# Patient Record
Sex: Male | Born: 1995 | Race: Black or African American | Hispanic: No | Marital: Single | Smoking: Current some day smoker
Health system: Southern US, Community
[De-identification: ages and names within clinical notes are randomized; demographics above are authoritative.]

## PROBLEM LIST (undated history)

## (undated) DIAGNOSIS — F1210 Cannabis abuse, uncomplicated: Secondary | ICD-10-CM

## (undated) DIAGNOSIS — T40601A Poisoning by unspecified narcotics, accidental (unintentional), initial encounter: Secondary | ICD-10-CM

---
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
## 2017-01-15 ENCOUNTER — Ambulatory Visit (HOSPITAL_COMMUNITY)
Admission: EM | Admit: 2017-01-15 | Discharge: 2017-01-15 | Disposition: A | Discharge status: Home / Self Care | Payer: Medicaid Other | Attending: Family Medicine | Admitting: Family Medicine

## 2017-01-15 ENCOUNTER — Encounter (HOSPITAL_COMMUNITY): Payer: Self-pay | Admitting: Emergency Medicine

## 2017-01-15 DIAGNOSIS — Z202 Contact with and (suspected) exposure to infections with a predominantly sexual mode of transmission: Secondary | ICD-10-CM | POA: Insufficient documentation

## 2017-01-15 DIAGNOSIS — Z113 Encounter for screening for infections with a predominantly sexual mode of transmission: Secondary | ICD-10-CM

## 2017-01-15 DIAGNOSIS — F17200 Nicotine dependence, unspecified, uncomplicated: Secondary | ICD-10-CM | POA: Diagnosis not present

## 2017-01-15 MED ORDER — METRONIDAZOLE 500 MG PO TABS: 500.0000 mg | ORAL_TABLET | Freq: Two times a day (BID) | ORAL | 0 refills | Status: DC

## 2017-01-15 NOTE — Discharge Instructions (Signed)
A test is being run for trichomoniasis. If it is positive, we should know in 2 days and give you a call.  If the test is negative, we may not get back to for a couple extra days (4 days) because we have some a test report.

## 2017-01-15 NOTE — ED Triage Notes (Signed)
Pt here with possible exposure to STD; pt denies sx

## 2017-01-15 NOTE — ED Provider Notes (Signed)
  [facility]   [id number] 01/15/17 Arrival Time: 1758   SUBJECTIVE:  Paul Bonilla is a 21 y.o. male who presents to the urgent care with complaint of exposure to STD; pt denies sx.  Cough friend reports that she was tested recently for STDs and was found to have Trichomonas.     History reviewed. No pertinent past medical history. History reviewed. No pertinent family history. Social History   Social History  . Marital status: Single    Spouse name: N/A  . Number of children: N/A  . Years of education: N/A   Occupational History  . Not on file.   Social History Main Topics  . Smoking status: Current Every Day Smoker  . Smokeless tobacco: Never Used  . Alcohol use Yes     Comment: occ  . Drug use: No  . Sexual activity: Not on file   Other Topics Concern  . Not on file   Social History Narrative  . No narrative on file   No outpatient prescriptions have been marked as taking for the 01/15/17 encounter [facility] Encounter).   No Known Allergies    ROS: As per HPI, remainder of ROS negative.   OBJECTIVE:   Vitals:   01/15/17 1815  BP: 105/69  Pulse: 64  Resp: 18  Temp: 98.6 F (37 C)  TempSrc: Oral  SpO2: 99%     General appearance: alert; no distress Eyes: PERRL; EOMI; conjunctiva normal HENT: normocephalic; atraumatic; TMs normal, canal normal, external ears normal without trauma; nasal mucosa normal; oral mucosa normal Neck: supple Lungs: clear to auscultation bilaterally Heart: regular rate and rhythm Abdomen: soft, non-tender; bowel sounds normal; no masses or organomegaly; no guarding or rebound tenderness Back: no CVA tenderness Extremities: no cyanosis or edema; symmetrical with no gross deformities Skin: warm and dry Neurologic: normal gait; grossly normal Psychological: alert and cooperative; normal mood and affect      Labs:  No results found for this or any previous visit.  Labs Reviewed  URINE CYTOLOGY  ANCILLARY ONLY    No results found.     ASSESSMENT & PLAN:  1. STD exposure     Meds ordered this encounter  Medications  . metroNIDAZOLE (FLAGYL) 500 MG tablet    Sig: Take 1 tablet (500 mg total) by mouth 2 (two) times daily.    Dispense:  14 tablet    Refill:  0    Reviewed expectations re: course of current medical issues. Questions answered. Outlined signs and symptoms indicating need for more acute intervention. Patient verbalized understanding. After Visit Summary given.    Procedures:      Elvina Sidle, MD 01/15/17 [phone]

## 2017-01-16 LAB — URINE CYTOLOGY ANCILLARY ONLY
Chlamydia: NEGATIVE
Neisseria Gonorrhea: NEGATIVE
Trichomonas: NEGATIVE

## 2018-05-07 ENCOUNTER — Other Ambulatory Visit: Payer: Self-pay

## 2018-05-07 ENCOUNTER — Emergency Department (HOSPITAL_COMMUNITY)
Admission: EM | Admit: 2018-05-07 | Discharge: 2018-05-07 | Disposition: A | Discharge status: Home / Self Care | Payer: Medicaid Other | Attending: Emergency Medicine | Admitting: Emergency Medicine

## 2018-05-07 ENCOUNTER — Encounter (HOSPITAL_COMMUNITY): Payer: Self-pay

## 2018-05-07 DIAGNOSIS — F17210 Nicotine dependence, cigarettes, uncomplicated: Secondary | ICD-10-CM | POA: Insufficient documentation

## 2018-05-07 DIAGNOSIS — N342 Other urethritis: Secondary | ICD-10-CM

## 2018-05-07 DIAGNOSIS — Z79899 Other long term (current) drug therapy: Secondary | ICD-10-CM | POA: Insufficient documentation

## 2018-05-07 DIAGNOSIS — Z113 Encounter for screening for infections with a predominantly sexual mode of transmission: Secondary | ICD-10-CM

## 2018-05-07 DIAGNOSIS — N341 Nonspecific urethritis: Secondary | ICD-10-CM | POA: Insufficient documentation

## 2018-05-07 MED ORDER — LIDOCAINE HCL (PF) 1 % IJ SOLN
INTRAMUSCULAR | Status: AC
  Administered 2018-05-07: 2 mL
  Filled 2018-05-07: qty 5

## 2018-05-07 MED ORDER — AZITHROMYCIN 250 MG PO TABS
1000.0000 mg | ORAL_TABLET | Freq: Once | ORAL | Status: AC
  Administered 2018-05-07: 1000 mg via ORAL
  Filled 2018-05-07: qty 4

## 2018-05-07 MED ORDER — CEFTRIAXONE SODIUM 250 MG IJ SOLR
250.0000 mg | Freq: Once | INTRAMUSCULAR | Status: AC
  Administered 2018-05-07: 250 mg via INTRAMUSCULAR
  Filled 2018-05-07: qty 250

## 2018-05-07 NOTE — ED Notes (Signed)
Patient verbalizes understanding of discharge instructions. Opportunity for questioning and answers were provided. Armband removed by staff, pt discharged from ED.  

## 2018-05-07 NOTE — ED Triage Notes (Signed)
Pt presents for eval of STI- pt states he has protected sex "sometimes". Pt endorses burning with urination, slight penile discharge.

## 2018-05-07 NOTE — ED Provider Notes (Signed)
[facility] EMERGENCY DEPARTMENT Provider Note   CSN: [id number] Arrival date & time: 05/07/18  1408     History   Chief Complaint Chief Complaint  Patient presents with  . SEXUALLY TRANSMITTED DISEASE    HPI Paul Bonilla is a 23 y.o. male.  Pt presents to the ED today with pain with urination and penile d/c.  She is sexually active.  He uses condoms occasionally.  No known STD exposure.     History reviewed. No pertinent past medical history.  There are no active problems to display for this patient.   History reviewed. No pertinent surgical history.      Home Medications    Prior to Admission medications   Medication Sig Start Date End Date Taking? Authorizing Provider  metroNIDAZOLE (FLAGYL) 500 MG tablet Take 1 tablet (500 mg total) by mouth 2 (two) times daily. 01/15/17   Elvina Sidle, MD    Family History No family history on file.  Social History Social History   Tobacco Use  . Smoking status: Current Some Day Smoker  . Smokeless tobacco: Never Used  Substance Use Topics  . Alcohol use: Yes    Comment: occ  . Drug use: Yes    Frequency: 7.0 times per week    Types: Marijuana     Allergies   Patient has no known allergies.   Review of Systems Review of Systems  Genitourinary: Positive for discharge and dysuria.  All other systems reviewed and are negative.    Physical Exam Updated Vital Signs BP 122/82 (BP Location: Left Arm)   Pulse 77   Temp 98.7 F (37.1 C) (Oral)   Resp 16   SpO2 98%   Physical Exam Vitals signs and nursing note reviewed. Exam conducted with a chaperone present.  Constitutional:      Appearance: Normal appearance.  HENT:     Head: Normocephalic and atraumatic.     Right Ear: External ear normal.     Left Ear: External ear normal.     Nose: Nose normal.     Mouth/Throat:     Mouth: Mucous membranes are moist.  Eyes:     Extraocular Movements: Extraocular movements intact.   Conjunctiva/sclera: Conjunctivae normal.     Pupils: Pupils are equal, round, and reactive to light.  Neck:     Musculoskeletal: Normal range of motion.  Cardiovascular:     Rate and Rhythm: Normal rate and regular rhythm.     Pulses: Normal pulses.  Pulmonary:     Effort: Pulmonary effort is normal.  Abdominal:     General: Abdomen is flat.  Genitourinary:    Penis: Normal and circumcised.      Scrotum/Testes: Normal.  Neurological:     Mental Status: He is alert and oriented to person, place, and time.      ED Treatments / Results  Labs (all labs ordered are listed, but only abnormal results are displayed) Labs Reviewed  GC/CHLAMYDIA PROBE AMP ([place]) NOT AT [facility]    EKG None  Radiology No results found.  Procedures Procedures (including critical care time)  Medications Ordered in ED Medications  cefTRIAXone (ROCEPHIN) injection 250 mg (has no administration in time range)  azithromycin (ZITHROMAX) tablet 1,000 mg (has no administration in time range)     Initial Impression / Assessment and Plan / ED Course  I have reviewed the triage vital signs and the nursing notes.  Pertinent labs & imaging results that were available during my  care of the patient were reviewed by me and considered in my medical decision making (see chart for details).     Urethral swab obtained.  Pt will be treated for STD with Rocephin and zithromax.  Pt encouraged to use condoms when having sex.  Return if worse.  Final Clinical Impressions(s) / ED Diagnoses   Final diagnoses:  Encounter for screening examination for sexually transmitted disease  Urethritis    ED Discharge Orders    None       Jacalyn LefevreHaviland, Collen Hostler, MD 05/07/18 1442

## 2018-05-10 LAB — GC/CHLAMYDIA PROBE AMP (~~LOC~~) NOT AT ARMC
Chlamydia: POSITIVE — AB
Neisseria Gonorrhea: NEGATIVE

## 2018-07-10 ENCOUNTER — Encounter (HOSPITAL_COMMUNITY): Payer: Self-pay

## 2018-07-10 ENCOUNTER — Emergency Department (HOSPITAL_COMMUNITY): Payer: Self-pay

## 2018-07-10 ENCOUNTER — Other Ambulatory Visit: Payer: Self-pay

## 2018-07-10 ENCOUNTER — Emergency Department (HOSPITAL_COMMUNITY)
Admission: EM | Admit: 2018-07-10 | Discharge: 2018-07-10 | Disposition: A | Discharge status: Home / Self Care | Payer: Self-pay | Attending: Emergency Medicine | Admitting: Emergency Medicine

## 2018-07-10 DIAGNOSIS — J069 Acute upper respiratory infection, unspecified: Secondary | ICD-10-CM | POA: Insufficient documentation

## 2018-07-10 DIAGNOSIS — B9789 Other viral agents as the cause of diseases classified elsewhere: Secondary | ICD-10-CM

## 2018-07-10 DIAGNOSIS — F17290 Nicotine dependence, other tobacco product, uncomplicated: Secondary | ICD-10-CM | POA: Insufficient documentation

## 2018-07-10 LAB — CBC WITH DIFFERENTIAL/PLATELET
Abs Immature Granulocytes: 0.01 10*3/uL (ref 0.00–0.07)
BASOS ABS: 0 10*3/uL (ref 0.0–0.1)
Basophils Relative: 1 %
Eosinophils Absolute: 0.2 10*3/uL (ref 0.0–0.5)
Eosinophils Relative: 4 %
HCT: 40.7 % (ref 39.0–52.0)
Hemoglobin: 13.5 g/dL (ref 13.0–17.0)
Immature Granulocytes: 0 %
Lymphocytes Relative: 40 %
Lymphs Abs: 1.6 10*3/uL (ref 0.7–4.0)
MCH: 31 pg (ref 26.0–34.0)
MCHC: 33.2 g/dL (ref 30.0–36.0)
MCV: 93.6 fL (ref 80.0–100.0)
Monocytes Absolute: 0.5 10*3/uL (ref 0.1–1.0)
Monocytes Relative: 13 %
Neutro Abs: 1.7 10*3/uL (ref 1.7–7.7)
Neutrophils Relative %: 42 %
PLATELETS: 263 10*3/uL (ref 150–400)
RBC: 4.35 MIL/uL (ref 4.22–5.81)
RDW: 12 % (ref 11.5–15.5)
WBC: 4 10*3/uL (ref 4.0–10.5)
nRBC: 0 % (ref 0.0–0.2)

## 2018-07-10 LAB — BASIC METABOLIC PANEL
Anion gap: 8 (ref 5–15)
BUN: 14 mg/dL (ref 6–20)
CO2: 23 mmol/L (ref 22–32)
Calcium: 8.8 mg/dL — ABNORMAL LOW (ref 8.9–10.3)
Chloride: 107 mmol/L (ref 98–111)
Creatinine, Ser: 0.97 mg/dL (ref 0.61–1.24)
GFR calc Af Amer: >60 mL/min (ref >60)
Glucose, Bld: 86 mg/dL (ref 70–99)
Potassium: 4.1 mmol/L (ref 3.5–5.1)
Sodium: 138 mmol/L (ref 135–145)

## 2018-07-10 LAB — MAGNESIUM: MAGNESIUM: 2 mg/dL (ref 1.7–2.4)

## 2018-07-10 LAB — TROPONIN I

## 2018-07-10 LAB — TSH: TSH: 0.897 u[IU]/mL (ref 0.350–4.500)

## 2018-07-10 MED ORDER — BENZONATATE 100 MG PO CAPS: 100.0000 mg | ORAL_CAPSULE | Freq: Three times a day (TID) | ORAL | 0 refills | Status: DC

## 2018-07-10 NOTE — ED Notes (Signed)
Patient transported to X-ray 

## 2018-07-10 NOTE — ED Triage Notes (Signed)
Pt endorses productive cough with green mucous since Monday and "My chest hurts when I cough" Denies travel, exposure, shob, bodyaches or chills. Oral temp 99.3.

## 2018-07-10 NOTE — ED Notes (Signed)
Pt verbalized understanding of d/c instructions and has no further questions, VSS, NAD.  

## 2018-07-10 NOTE — ED Provider Notes (Signed)
[facility] EMERGENCY DEPARTMENT Provider Note   CSN: [id number] Arrival date & time: 07/10/18  1542  History   Chief Complaint Chief Complaint  Patient presents with   Cough    ]   HPI Paul Bonilla is a 23 y.o. male with no signifiant past medical history who presents for evaluation of chest pain.  Patient states he has been sick x1 week.  Patient states he has had chest pain x2 days, worse with cough.  Patient states he is coughing up dark green mucus.  Denies radiation of pain.  Rates his pain a 3/10.  Describes pain as an aching sensation.  Patient states he is also had congestion as well as rhinorrhea which is clear in color.  Patient denies recent travel, fever, body aches and pains, chills, neck pain, neck stiffness, sore throat, shortness of breath, abdominal pain, diarrhea dysuria.  Patient is not any exposures to lab positive coronavirus patient.  Patient states his sister whom he lives is also has similar symptoms.  She states his symptoms have improved since onset last week, however "wants to get checked out."  Denies hemoptysis, lower extremity edema, erythema, warmth, tenderness to bilateral calves.  No PND or orthopnea. Patient states that he has also had intermittent palpitations over the last 48 hours.  Has had increased caffeine use over the last week secondary to using Benadryl for symptoms above. States these were worse yesterday evening when he was trying to fall asleep. Denies hx of previous palpitations, retention, hyperlipidemia, diabetes.  Patient does have a family history of MI at 8 in his cousin.  History obtained from patient.  No interpreter was used.   HPI  History reviewed. No pertinent past medical history.  There are no active problems to display for this patient.   History reviewed. No pertinent surgical history.    Home Medications    Prior to Admission medications   Medication Sig Start Date End Date Taking? Authorizing  Provider  benzonatate (TESSALON) 100 MG capsule Take 1 capsule (100 mg total) by mouth every 8 (eight) hours. 07/10/18   Jazaria Jarecki A, PA-C  metroNIDAZOLE (FLAGYL) 500 MG tablet Take 1 tablet (500 mg total) by mouth 2 (two) times daily. 01/15/17   Elvina Sidle, MD    Family History History reviewed. No pertinent family history.  Social History Social History   Tobacco Use   Smoking status: Current Some Day Smoker    Types: Cigars   Smokeless tobacco: Never Used  Substance Use Topics   Alcohol use: Yes    Comment: occ   Drug use: Yes    Frequency: 7.0 times per week    Types: Marijuana     Allergies   Patient has no known allergies.   Review of Systems Review of Systems  Constitutional: Negative.   HENT: Positive for congestion and rhinorrhea. Negative for postnasal drip, sinus pressure, sinus pain, sneezing, sore throat, tinnitus, trouble swallowing and voice change.   Respiratory: Positive for cough. Negative for apnea, choking, shortness of breath, wheezing and stridor.   Cardiovascular: Positive for chest pain and palpitations. Negative for leg swelling.  Gastrointestinal: Negative.   Genitourinary: Negative.   Musculoskeletal: Negative.   Skin: Negative.   Neurological: Negative.   All other systems reviewed and are negative.  Physical Exam Updated Vital Signs BP 118/80    Pulse 71    Temp 99.3 F (37.4 C) (Oral)    Resp 20    SpO2 97%  Physical Exam Vitals signs and nursing note reviewed.  Constitutional:      General: He is not in acute distress.    Appearance: He is not ill-appearing, toxic-appearing or diaphoretic.  HENT:     Head: Normocephalic and atraumatic.     Jaw: There is normal jaw occlusion.     Right Ear: Tympanic membrane, ear canal and external ear normal. There is no impacted cerumen. No hemotympanum. Tympanic membrane is not injected, scarred, perforated, erythematous, retracted or bulging.     Left Ear: Tympanic membrane, ear  canal and external ear normal. There is no impacted cerumen. No hemotympanum. Tympanic membrane is not injected, scarred, perforated, erythematous, retracted or bulging.     Ears:     Comments: No Mastoid tenderness.    Nose:     Comments: Clear rhinorrhea and congestion to bilateral nares.  No sinus tenderness.    Mouth/Throat:     Comments: Posterior oropharynx clear.  Mucous membranes moist.  Tonsils without erythema or exudate.  Uvula midline without deviation.  No evidence of PTA or RPA.  No drooling, dysphasia or trismus.  Phonation normal. Neck:     Trachea: Trachea and phonation normal.     Meningeal: Brudzinski's sign and Kernig's sign absent.     Comments: No Neck stiffness or neck rigidity.  No meningismus.  No cervical lymphadenopathy. Cardiovascular:     Comments: No murmurs rubs or gallops. Pulmonary:     Comments: Clear to auscultation bilaterally without wheeze, rhonchi or rales.  No accessory muscle usage.  Able speak in full sentences. Chest:     Comments: Pain reproducible to palpation diffusely across chest.  No deformity, swelling, crepitus or edema to chest. Abdominal:     Comments: Soft, nontender without rebound or guarding.  No CVA tenderness.  Musculoskeletal:     Comments: Moves all 4 extremities without difficulty.  Lower extremities without edema, erythema or warmth.  No tenderness to bilateral calves.  Skin:    Comments: Brisk capillary refill.  No rashes or lesions.  Neurological:     Mental Status: He is alert.     Comments: Ambulatory in department without difficulty.  Cranial nerves II through XII grossly intact.  No facial droop.  No aphasia.    ED Treatments / Results  Labs (all labs ordered are listed, but only abnormal results are displayed) Labs Reviewed  BASIC METABOLIC PANEL - Abnormal; Notable for the following components:      Result Value   Calcium 8.8 (*)    All other components within normal limits  CBC WITH DIFFERENTIAL/PLATELET    TROPONIN I  MAGNESIUM  TSH    EKG EKG Interpretation  Date/Time:  Saturday July 10 2018 15:54:39 EDT Ventricular Rate:  78 PR Interval:    QRS Duration: 78 QT Interval:  352 QTC Calculation: 401 R Axis:   56 Text Interpretation:  Sinus rhythm Confirmed by Jacalyn Lefevre [phone]) on 07/10/2018 4:17:29 PM   Radiology Dg Chest 2 View  Result Date: 07/10/2018 CLINICAL DATA:  Productive cough. EXAM: CHEST - 2 VIEW COMPARISON:  None. FINDINGS: The heart size and mediastinal contours are within normal limits. Both lungs are clear. The visualized skeletal structures are unremarkable. IMPRESSION: No active cardiopulmonary disease. Electronically Signed   By: Gerome Sam III M.D   On: 07/10/2018 17:01    Procedures Procedures (including critical care time)  Medications Ordered in ED Medications - No data to display  Initial Impression / Assessment and Plan / ED Course  I have reviewed the triage vital signs and the nursing notes.  Pertinent labs & imaging results that were available during my care of the patient were reviewed by me and considered in my medical decision making (see chart for details).  23 year old male appears otherwise well presents for evaluation of chest pain.  Afebrile, nonseptic, non-ill-appearing.  Has also had flulike symptoms with congestion, rhinorrhea, productive cough of green sputum.  Symptom onset 1 week ago, however have been improving since onset.  No known exposure to lab positive coronavirus patients, recent travel.  Does have family with similar symptoms.  Has also had intermittent palpitations, insetting of increased caffeine intake over the last week.  Clear to auscultation bilateral without wheeze, rhonchi or rales.  No tachypnea, tachycardia or hypoxia, low suspicion for pneumonia. PERC negative, Wells criteria low risk, heart score 1(family hx early age).  CP reproducible to palpation to chest.  Given family history, palpitations obtain labs,  imaging, reevaluate.  Anticipate likely DC home after work up.  Labs and imaging personally viewed by myself:  CBC without leukocytosis, hemoglobin 13.5, troponin negative, given sx for > 24 hours do not feel delta troponin needed at this time. If ACS related feel troponin would be elevated currently, Metabolic panel without electrolyte, renal or liver abnormality, magnesium 2.0, TSH WNL, chest x-ray without evidence of infiltrates, cardiomegaly, pulmonary edema or pneumothorax.  EKG without ischemic changes.  Vitals are stable. No signs of dehydration, tolerating PO's.  Lungs are clear.  Patient will be discharged with instructions to orally hydrate, rest, and use over-the-counter medications such as anti-inflammatories ibuprofen and Aleve for muscle aches and Tylenol for fever.  Patient will also be given a cough suppressant.  He does not meet current CDC criteria for coronavirus testing at this time.  Discussed with patient that this cannot be ruled out due to community acquired coronavirus in [place] at this time.  It is reassuring that patient has had improvement in symptoms over the last week.  Discussed with patient home isolation over the next 3 days given he still currently has symptoms.  PCP for reevaluation if symptoms do not resolve.  Hemodynamically stable and appropriate for DC home at this time.  Discussed strict return precautions.  Patient voiced understanding and is agreeable to follow-up. Palpitations likely due to increased caffeine intake. Discussed cessation of caffeine and FU with PCP for reevaluation.     Paul Bonilla was evaluated in Emergency Department on 07/10/2018 for the symptoms described in the history of present illness. He was evaluated in the context of the global COVID-19 pandemic, which necessitated consideration that the patient might be at risk for infection with the SARS-CoV-2 virus that causes COVID-19. Institutional protocols and algorithms that pertain to the  evaluation of patients at risk for COVID-19 are in a state of rapid change based on information released by regulatory bodies including the CDC and federal and state organizations. These policies and algorithms were followed during the patient's care in the ED. Final Clinical Impressions(s) / ED Diagnoses   Final diagnoses:  Viral URI with cough    ED Discharge Orders         Ordered    benzonatate (TESSALON) 100 MG capsule  Every 8 hours     07/10/18 1744           Kinley Ferrentino A, PA-C 07/10/18 1751    Jacalyn Lefevre, MD 07/10/18 1819

## 2018-07-10 NOTE — Discharge Instructions (Signed)
Evaluated today for chest pain and cough.  This is likely a viral upper respiratory infection I given you Tessalon Perles to  help with your cough.  Take as prescribed.  He did not meet current CDC criteria for coronavirus testing at this time.  I would recommend given you are still having symptoms to stay home over the next 3 days.  If you develop severe shortness of breath, high fever return to the emergency department for evaluation.

## 2018-12-22 ENCOUNTER — Other Ambulatory Visit (HOSPITAL_COMMUNITY)
Admission: RE | Admit: 2018-12-22 | Discharge: 2018-12-22 | Disposition: A | Discharge status: Home / Self Care | Payer: Self-pay | Source: Ambulatory Visit | Attending: Emergency Medicine | Admitting: Emergency Medicine

## 2018-12-22 ENCOUNTER — Other Ambulatory Visit: Payer: Self-pay

## 2018-12-22 ENCOUNTER — Emergency Department
Admission: EM | Admit: 2018-12-22 | Discharge: 2018-12-22 | Disposition: A | Discharge status: Home / Self Care | Payer: Self-pay | Source: Home / Self Care | Attending: Emergency Medicine | Admitting: Emergency Medicine

## 2018-12-22 DIAGNOSIS — Z202 Contact with and (suspected) exposure to infections with a predominantly sexual mode of transmission: Secondary | ICD-10-CM

## 2018-12-22 DIAGNOSIS — N342 Other urethritis: Secondary | ICD-10-CM

## 2018-12-22 DIAGNOSIS — R369 Urethral discharge, unspecified: Secondary | ICD-10-CM

## 2018-12-22 LAB — POCT URINALYSIS DIP (MANUAL ENTRY)
Bilirubin, UA: NEGATIVE
Blood, UA: NEGATIVE
Glucose, UA: NEGATIVE mg/dL
Ketones, POC UA: NEGATIVE mg/dL
Leukocytes, UA: NEGATIVE
Nitrite, UA: NEGATIVE
Protein Ur, POC: NEGATIVE mg/dL
Spec Grav, UA: >=1.03 — AB (ref 1.010–1.025)
Urobilinogen, UA: NEGATIVE E.U./dL — AB
pH, UA: 5.5 (ref 5.0–8.0)

## 2018-12-22 MED ORDER — DOXYCYCLINE HYCLATE 100 MG PO CAPS: 100.0000 mg | ORAL_CAPSULE | Freq: Two times a day (BID) | ORAL | 0 refills | Status: DC

## 2018-12-22 MED ORDER — CEFTRIAXONE SODIUM 250 MG IJ SOLR
250.0000 mg | Freq: Once | INTRAMUSCULAR | Status: AC
  Administered 2018-12-22: 250 mg via INTRAMUSCULAR

## 2018-12-22 MED ORDER — CEFTRIAXONE SODIUM 250 MG IJ SOLR: 500.0000 mg | Freq: Once | INTRAMUSCULAR | Status: DC

## 2018-12-22 NOTE — ED Triage Notes (Signed)
Patient reports discharge from penis and slight dysuria over past 3-4 days; he desires STD testing. He has not travelled past 4 weeks.

## 2018-12-22 NOTE — Discharge Instructions (Addendum)
Based on your history and exam, you likely have urethral infection, possibly STD. Today, we are giving you a shot of Rocephin which would cure gonorrhea. Prescription for doxycycline sent to pharmacy.  Take 1 twice a day for the full 10 days.-This will treat chlamydia. (The doxycycline is instead of taking 5 Zithromax pills, which caused nausea and vomiting for you in the past) We are sending off various tests which should come back in 2 to 3 days. Tests include: Gonorrhea, chlamydia,-from urine sample Trichomonas and bacterial vaginosis and yeast-from the penis insertion swab today. Blood test today: For syphilis and HIV Please read attached instruction sheets on urethritis and safe sex. Follow-up here or with your PCP if no better in 14 days, sooner if worse or new symptoms.

## 2018-12-22 NOTE — ED Provider Notes (Signed)
[facility]    CSN: [id number] Arrival date & time: 12/22/18  0913      History   Chief Complaint Chief Complaint  Patient presents with  . Exposure to STD  Chief complaint: Penile discharge for 1 week or more.  HPI Paul Bonilla is a 23 y.o. male.   HPI Sexually active with new male sex partner for the past month.  Does not always use condoms. Complains of 1 week of penile discharge, slightly yellow sometimes clear.  Occasional dysuria.  Penile lesions or sores or blisters or warts. History of STD in the past. Denies fever or chills or abdominal pain or nausea or vomiting No past medical history on file.  There are no active problems to display for this patient.   No past surgical history on file.     Home Medications    Prior to Admission medications   Medication Sig Start Date End Date Taking? Authorizing Provider  doxycycline (VIBRAMYCIN) 100 MG capsule Take 1 capsule (100 mg total) by mouth 2 (two) times daily. Take for 10 full days until medicine runs out 12/22/18   Lajean ManesMassey, David, MD    Family History No family history on file.  Social History Social History   Tobacco Use  . Smoking status: Current Some Day Smoker    Types: Cigars  . Smokeless tobacco: Never Used  Substance Use Topics  . Alcohol use: Yes    Comment: occ  . Drug use: Yes    Frequency: 7.0 times per week    Types: Marijuana     Allergies   Patient has no known allergies.   Review of Systems Review of Systems Pertinent items noted in HPI and remainder of comprehensive ROS otherwise negative.   Physical Exam Triage Vital Signs ED Triage Vitals  Enc Vitals Group     BP 12/22/18 0942 118/73     Pulse Rate 12/22/18 0942 67     Resp 12/22/18 0942 16     Temp 12/22/18 0942 97.7 F (36.5 C)     Temp Source 12/22/18 0942 Oral     SpO2 12/22/18 0942 97 %     Weight 12/22/18 0943 165 lb (74.8 kg)     Height 12/22/18 0943 5\' 5"  (1.651 m)     Head Circumference --       Peak Flow --      Pain Score 12/22/18 0943 1     Pain Loc --      Pain Edu? --      Excl. in GC? --    No data found.  Updated Vital Signs BP 118/73 (BP Location: Right Arm)   Pulse 67   Temp 97.7 F (36.5 C) (Oral)   Resp 16   Ht 5\' 5"  (1.651 m)   Wt 74.8 kg   SpO2 97%   BMI 27.46 kg/m   Visual Acuity Right Eye Distance:   Left Eye Distance:   Bilateral Distance:    Right Eye Near:   Left Eye Near:    Bilateral Near:     Physical Exam Vitals signs reviewed.  Constitutional:      General: He is not in acute distress.    Appearance: He is well-developed.  HENT:     Head: Normocephalic and atraumatic.  Eyes:     General: No scleral icterus.    Pupils: Pupils are equal, round, and reactive to light.  Neck:     Musculoskeletal: Normal range of motion and neck  supple.  Cardiovascular:     Rate and Rhythm: Normal rate and regular rhythm.  Pulmonary:     Effort: Pulmonary effort is normal.  Abdominal:     General: There is no distension.  Genitourinary:    Comments: No skin lesions. Testicles normal without swelling or tenderness or masses. Penis normal except minimal clearish yellow urethral discharge. No inguinal adenopathy Skin:    General: Skin is warm and dry.  Neurological:     Mental Status: He is alert and oriented to person, place, and time.     Cranial Nerves: No cranial nerve deficit.  Psychiatric:        Behavior: Behavior normal.      UC Treatments / Results  Labs (all labs ordered are listed, but only abnormal results are displayed) Labs Reviewed  POCT URINALYSIS DIP (MANUAL ENTRY) - Abnormal; Notable for the following components:      Result Value   Spec Grav, UA >=1.030 (*)    Urobilinogen, UA negative (*)    All other components within normal limits  C. TRACHOMATIS/N. GONORRHOEAE RNA  RPR  HIV ANTIBODY (ROUTINE TESTING W REFLEX)  CERVICOVAGINAL ANCILLARY ONLY  CERVICOVAGINAL ANCILLARY ONLY    EKG   Radiology No results  found.  Procedures Procedures (including critical care time)  Medications Ordered in UC Medications  cefTRIAXone (ROCEPHIN) injection 250 mg (250 mg Intramuscular Given 12/22/18 1035)    Initial Impression / Assessment and Plan / UC Course  I have reviewed the triage vital signs and the nursing notes.  Pertinent labs & imaging results that were available during my care of the patient were reviewed by me and considered in my medical decision making (see chart for details).    We discussed testing and treatment options at length and after risk benefits alternatives, we performed the following tests: UA today within normal limits. Urine sent for GC and chlamydia testing. At he has request and with his permission, I performed intra-urethral swab with Q-tip, to send for "cervical vaginal ancillary", which includes testing for trichomonas, yeast and bacterial vaginosis pathogens.  He requested aggressive treatment to cover possible GC, Rocephin 250 mg IM given stat. To cover chlamydia, we discussed options.  He states he remembers being treated for chlamydia in the past with Zithromax pills all at once, and that caused nausea and vomiting so he declined Zithromax today. I prescribed doxycycline 100 mg twice daily x10 days and explained he needs to complete the whole 10-day course and explained risks of not doing so.  Advised no sex until completing course of doxycycline.  He voiced understanding and agreement with above.  Final Clinical Impressions(s) / UC Diagnoses   Final diagnoses:  STD exposure  Penile discharge  Urethritis     Discharge Instructions     Based on your history and exam, you likely have urethral infection, possibly STD. Today, we are giving you a shot of Rocephin which would cure gonorrhea. Prescription for doxycycline sent to pharmacy.  Take 1 twice a day for the full 10 days.-This will treat chlamydia. (The doxycycline is instead of taking 5 Zithromax pills,  which caused nausea and vomiting for you in the past) We are sending off various tests which should come back in 2 to 3 days. Tests include: Gonorrhea, chlamydia,-from urine sample Trichomonas and bacterial vaginosis and yeast-from the penis insertion swab today. Blood test today: For syphilis and HIV Please read attached instruction sheets on urethritis and safe sex. Follow-up here or with your PCP  if no better in 14 days, sooner if worse or new symptoms.    ED Prescriptions    Medication Sig Dispense Auth. Provider   doxycycline (VIBRAMYCIN) 100 MG capsule  (Status: Discontinued) Take 1 capsule (100 mg total) by mouth 2 (two) times daily. For a full 10 days, until medicine runs out 20 capsule Jacqulyn Cane, MD   doxycycline (VIBRAMYCIN) 100 MG capsule Take 1 capsule (100 mg total) by mouth 2 (two) times daily. Take for 10 full days until medicine runs out 20 capsule Jacqulyn Cane, MD        Jacqulyn Cane, MD 12/22/18 [phone]

## 2018-12-23 LAB — HIV ANTIBODY (ROUTINE TESTING W REFLEX): HIV 1&2 Ab, 4th Generation: NONREACTIVE

## 2018-12-23 LAB — CERVICOVAGINAL ANCILLARY ONLY
Bacterial vaginitis: NEGATIVE
Candida vaginitis: NEGATIVE
Trichomonas: NEGATIVE

## 2018-12-23 LAB — RPR: RPR Ser Ql: NONREACTIVE

## 2018-12-24 LAB — C. TRACHOMATIS/N. GONORRHOEAE RNA
C. trachomatis RNA, TMA: DETECTED — AB
N. gonorrhoeae RNA, TMA: NOT DETECTED

## 2018-12-27 ENCOUNTER — Encounter (HOSPITAL_COMMUNITY): Payer: Self-pay

## 2018-12-27 ENCOUNTER — Telehealth (HOSPITAL_COMMUNITY): Payer: Self-pay | Admitting: Emergency Medicine

## 2018-12-27 NOTE — Telephone Encounter (Signed)
Chlamydia is positive.  This was treated at the urgent care visit with po doxy.  Pt needs education to please refrain from sexual intercourse for 7 days to give the medicine time to work.  Sexual partners need to be notified and tested/treated.  Condoms may reduce risk of reinfection.  Recheck or followup with PCP for further evaluation if symptoms are not improving.  [facility] notified.  Attempted to reach patient. Someone answered the phone and said they would give him the message.

## 2018-12-29 ENCOUNTER — Telehealth (HOSPITAL_COMMUNITY): Payer: Self-pay | Admitting: Emergency Medicine

## 2018-12-29 NOTE — Telephone Encounter (Signed)
Attempted to reach patient x2. No answer at this time. Voicemail left.    

## 2018-12-31 ENCOUNTER — Telehealth (HOSPITAL_COMMUNITY): Payer: Self-pay | Admitting: Emergency Medicine

## 2018-12-31 NOTE — Telephone Encounter (Signed)
Pt returned call, given swab results, all questions answered.

## 2018-12-31 NOTE — Telephone Encounter (Signed)
Attempted to reach patient x3. No answer at this time. Voicemail left. Letter sent    

## 2020-06-23 IMAGING — CR CHEST - 2 VIEW
2 series · 2 of 2 positions shown · non-contrast
Comparison: None.

CLINICAL DATA: Productive cough.

EXAM:
CHEST - 2 VIEW

[chest pa]
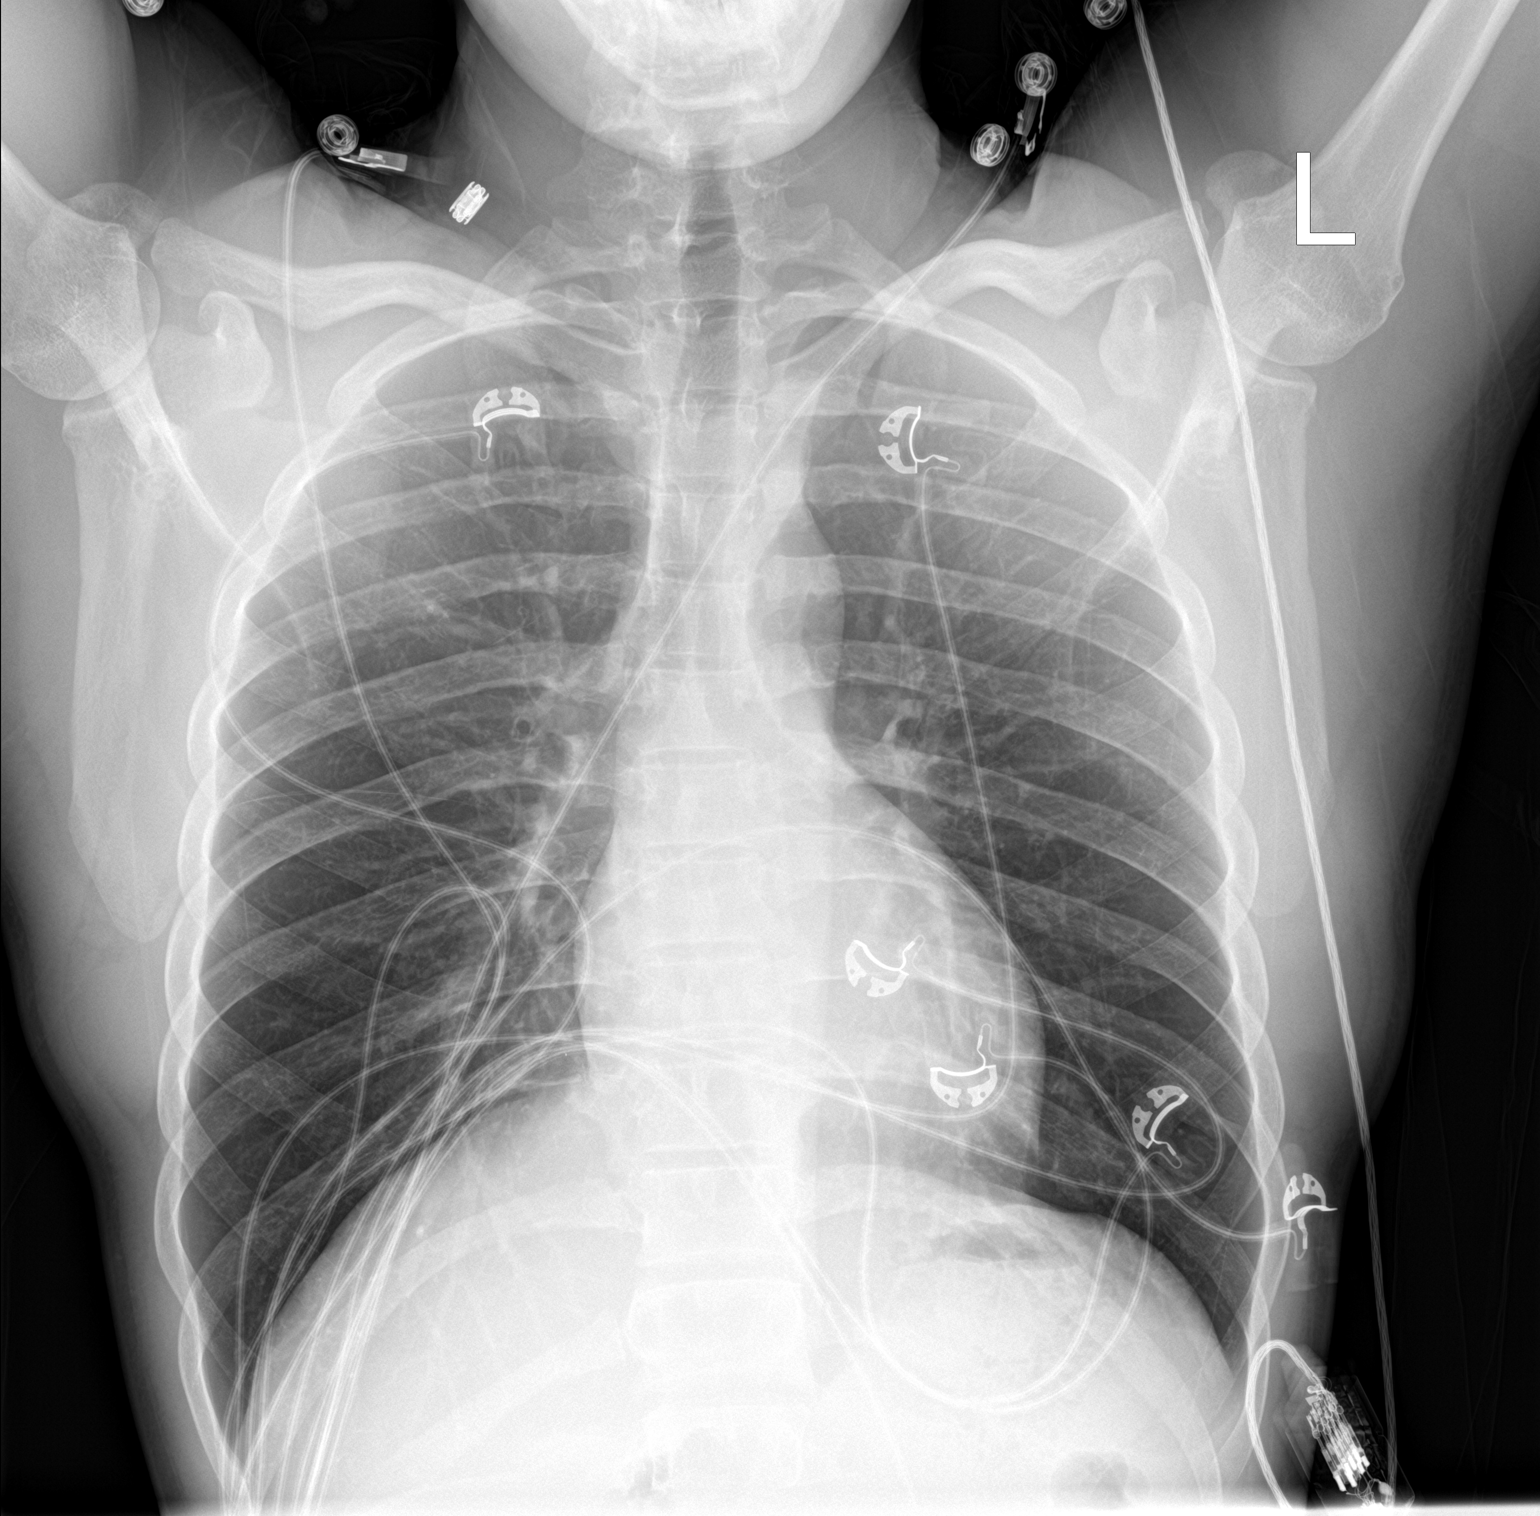

[chest lat]
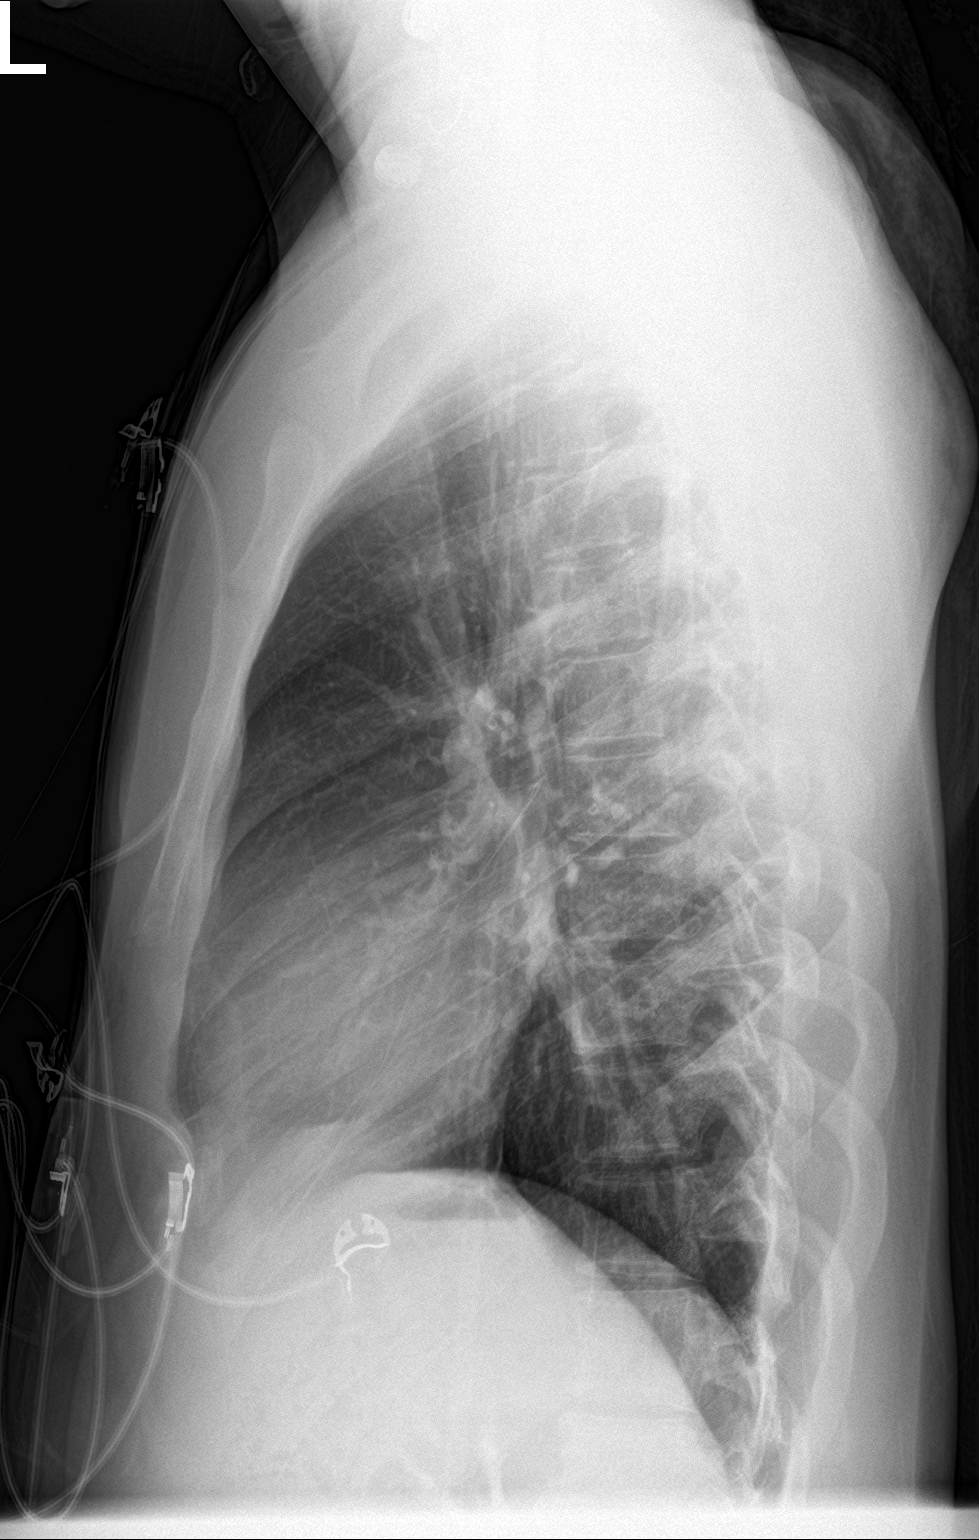

[2 of 2 positions shown; findings below may reference images not displayed]

FINDINGS: The heart size and mediastinal contours are within normal limits.
Both lungs are clear. The visualized skeletal structures are
unremarkable.
IMPRESSION: No active cardiopulmonary disease.

## 2021-11-02 ENCOUNTER — Ambulatory Visit (HOSPITAL_COMMUNITY)
Admission: RE | Admit: 2021-11-02 | Discharge: 2021-11-02 | Disposition: A | Discharge status: Home / Self Care | Payer: Self-pay | Source: Ambulatory Visit | Attending: Physician Assistant | Admitting: Physician Assistant

## 2021-11-02 ENCOUNTER — Encounter (HOSPITAL_COMMUNITY): Payer: Self-pay

## 2021-11-02 VITALS — BP 141/96 | HR 72 | Temp 97.6°F | Resp 16 | Ht 65.0 in | Wt 170.0 lb

## 2021-11-02 DIAGNOSIS — Z202 Contact with and (suspected) exposure to infections with a predominantly sexual mode of transmission: Secondary | ICD-10-CM | POA: Insufficient documentation

## 2021-11-02 DIAGNOSIS — Z113 Encounter for screening for infections with a predominantly sexual mode of transmission: Secondary | ICD-10-CM | POA: Insufficient documentation

## 2021-11-02 LAB — POCT URINALYSIS DIPSTICK, ED / UC
Bilirubin Urine: NEGATIVE
Glucose, UA: NEGATIVE mg/dL
Ketones, ur: NEGATIVE mg/dL
Leukocytes,Ua: NEGATIVE
Nitrite: NEGATIVE
Protein, ur: NEGATIVE mg/dL
Specific Gravity, Urine: 1.025 (ref 1.005–1.030)
Urobilinogen, UA: 1 mg/dL (ref 0.0–1.0)
pH: 5.5 (ref 5.0–8.0)

## 2021-11-02 LAB — HIV ANTIBODY (ROUTINE TESTING W REFLEX): HIV Screen 4th Generation wRfx: NONREACTIVE

## 2021-11-02 NOTE — Discharge Instructions (Addendum)
Advised that STI screening has been performed and the results will be back in 48 hours.  If you do not receive a call from our office then that indicates the test results are negative.  Can go on MyChart to be able to see the results once they post in 24 to 48 hours.

## 2021-11-02 NOTE — ED Provider Notes (Signed)
[facility]    CSN: [id number] Arrival date & time: 11/02/21  1127      History   Chief Complaint Chief Complaint  Patient presents with   Exposure to STD    HPI Paul Bonilla is a 26 y.o. male.   19-year-old male presents for STI screening.  Patient indicates that 2 months ago he had intercourse with partner and this was unprotected.  Patient indicates that he received a call from her indicating that she tested positive for trichomonas.  Patient indicates that he has been having regular intercourse with his present partner, unprotected.  Patient indicates that his partner has been complaining of some vaginal itching and irritability, she was tested and he just received a phone call saying that her test returned negative.  Patient indicates that he is doing well he is not having any penile discharge, no burning on urination, no unusual penile rashes.  Patient desires to be screened for STI to include HIV and RPR screening.  Patient indicates he is doing well without fever or chills.   Exposure to STD    History reviewed. No pertinent past medical history.  There are no problems to display for this patient.   History reviewed. No pertinent surgical history.     Home Medications    Prior to Admission medications   Medication Sig Start Date End Date Taking? Authorizing Provider  doxycycline (VIBRAMYCIN) 100 MG capsule Take 1 capsule (100 mg total) by mouth 2 (two) times daily. Take for 10 full days until medicine runs out 12/22/18   Lajean Manes, MD    Family History History reviewed. No pertinent family history.  Social History Social History   Tobacco Use   Smoking status: Some Days    Types: Cigars   Smokeless tobacco: Never  Substance Use Topics   Alcohol use: Yes    Comment: occ   Drug use: Yes    Frequency: 7.0 times per week    Types: Marijuana     Allergies   Patient has no known allergies.   Review of Systems Review of  Systems   Physical Exam Triage Vital Signs ED Triage Vitals  Enc Vitals Group     BP 11/02/21 1138 (!) 141/96     Pulse Rate 11/02/21 1138 72     Resp 11/02/21 1138 16     Temp 11/02/21 1138 97.6 F (36.4 C)     Temp Source 11/02/21 1138 Oral     SpO2 11/02/21 1138 99 %     Weight 11/02/21 1140 170 lb (77.1 kg)     Height 11/02/21 1140 5\' 5"  (1.651 m)     Head Circumference --      Peak Flow --      Pain Score 11/02/21 1140 0     Pain Loc --      Pain Edu? --      Excl. in GC? --    No data found.  Updated Vital Signs BP (!) 141/96 (BP Location: Right Arm)   Pulse 72   Temp 97.6 F (36.4 C) (Oral)   Resp 16   Ht 5\' 5"  (1.651 m)   Wt 170 lb (77.1 kg)   SpO2 99%   BMI 28.29 kg/m   Visual Acuity Right Eye Distance:   Left Eye Distance:   Bilateral Distance:    Right Eye Near:   Left Eye Near:    Bilateral Near:     Physical Exam Constitutional:  Appearance: Normal appearance.  Genitourinary:    Comments: Genitals: The penile shaft is normal without ulceration or lesions, head of the penis is normal, no penile discharge.  Scrotal sac is normal without swelling or lesions. Neurological:     Mental Status: He is alert.      UC Treatments / Results  Labs (all labs ordered are listed, but only abnormal results are displayed) Labs Reviewed  POCT URINALYSIS DIPSTICK, ED / UC - Abnormal; Notable for the following components:      Result Value   Hgb urine dipstick TRACE (*)    All other components within normal limits  HIV ANTIBODY (ROUTINE TESTING W REFLEX)  RPR  CYTOLOGY, (ORAL, ANAL, URETHRAL) ANCILLARY ONLY    EKG   Radiology No results found.  Procedures Procedures (including critical care time)  Medications Ordered in UC Medications - No data to display  Initial Impression / Assessment and Plan / UC Course  I have reviewed the triage vital signs and the nursing notes.  Pertinent labs & imaging results that were available during my  care of the patient were reviewed by me and considered in my medical decision making (see chart for details).    Plan: 1.  STI screening to include HIV and RPR testing is pending 2.  Patient advised to use protection during sexual contact. 3.  Patient advised to follow-up with PCP or return to urgent care if symptoms fail to improve. Final Clinical Impressions(s) / UC Diagnoses   Final diagnoses:  STD exposure  Routine screening for STI (sexually transmitted infection)     Discharge Instructions      Advised that STI screening has been performed and the results will be back in 48 hours.  If you do not receive a call from our office then that indicates the test results are negative.  Can go on MyChart to be able to see the results once they post in 24 to 48 hours.    ED Prescriptions   None    PDMP not reviewed this encounter.   Ellsworth Lennox, PA-C 11/02/21 1339

## 2021-11-02 NOTE — ED Triage Notes (Signed)
Patient states his partner was tested and treated for a STD. Partners results are not back yet. Patient states his partner has been having vaginal itching and pain.   Patient having penile irritation. No pain with urination, no discharge, no bumps/blisters.

## 2021-11-03 LAB — RPR: RPR Ser Ql: NONREACTIVE

## 2021-11-04 ENCOUNTER — Ambulatory Visit (HOSPITAL_COMMUNITY): Payer: Self-pay

## 2021-11-04 LAB — CYTOLOGY, (ORAL, ANAL, URETHRAL) ANCILLARY ONLY
Comment: NEGATIVE
Trichomonas: NEGATIVE

## 2021-11-05 ENCOUNTER — Telehealth (HOSPITAL_COMMUNITY): Payer: Self-pay | Admitting: Family Medicine

## 2021-11-05 MED ORDER — METRONIDAZOLE 500 MG PO TABS: 2000.0000 mg | ORAL_TABLET | Freq: Once | ORAL | 0 refills | Status: AC

## 2021-11-05 MED ORDER — METRONIDAZOLE 500 MG PO TABS: 2000.0000 mg | ORAL_TABLET | Freq: Three times a day (TID) | ORAL | 0 refills | Status: DC

## 2021-11-05 NOTE — Telephone Encounter (Signed)
Patient called his partner tested positive for trichomonas who was tested at the same time as he however his test came out negative.  Given exposure agreed to treat with metronidazole.  Sending a one-time dose of metronidazole to patient's pharmacy.

## 2021-11-14 ENCOUNTER — Encounter (HOSPITAL_COMMUNITY): Payer: Self-pay | Admitting: Internal Medicine

## 2021-11-14 ENCOUNTER — Observation Stay (HOSPITAL_COMMUNITY)
Admission: EM | Admit: 2021-11-14 | Discharge: 2021-11-15 | Disposition: A | Discharge status: Home / Self Care | Payer: Self-pay | Attending: Internal Medicine | Admitting: Internal Medicine

## 2021-11-14 ENCOUNTER — Emergency Department (HOSPITAL_COMMUNITY): Payer: Self-pay

## 2021-11-14 DIAGNOSIS — F1210 Cannabis abuse, uncomplicated: Secondary | ICD-10-CM | POA: Insufficient documentation

## 2021-11-14 DIAGNOSIS — T40601A Poisoning by unspecified narcotics, accidental (unintentional), initial encounter: Principal | ICD-10-CM | POA: Diagnosis present

## 2021-11-14 DIAGNOSIS — F17290 Nicotine dependence, other tobacco product, uncomplicated: Secondary | ICD-10-CM | POA: Insufficient documentation

## 2021-11-14 DIAGNOSIS — T50901A Poisoning by unspecified drugs, medicaments and biological substances, accidental (unintentional), initial encounter: Secondary | ICD-10-CM

## 2021-11-14 DIAGNOSIS — J9601 Acute respiratory failure with hypoxia: Secondary | ICD-10-CM | POA: Insufficient documentation

## 2021-11-14 DIAGNOSIS — J189 Pneumonia, unspecified organism: Secondary | ICD-10-CM

## 2021-11-14 DIAGNOSIS — J690 Pneumonitis due to inhalation of food and vomit: Secondary | ICD-10-CM | POA: Insufficient documentation

## 2021-11-14 DIAGNOSIS — E876 Hypokalemia: Secondary | ICD-10-CM | POA: Insufficient documentation

## 2021-11-14 DIAGNOSIS — R918 Other nonspecific abnormal finding of lung field: Secondary | ICD-10-CM | POA: Insufficient documentation

## 2021-11-14 DIAGNOSIS — N179 Acute kidney failure, unspecified: Secondary | ICD-10-CM | POA: Insufficient documentation

## 2021-11-14 HISTORY — DX: Poisoning by unspecified narcotics, accidental (unintentional), initial encounter: T40.601A

## 2021-11-14 HISTORY — DX: Cannabis abuse, uncomplicated: F12.10

## 2021-11-14 LAB — COMPREHENSIVE METABOLIC PANEL
ALT: 53 U/L — ABNORMAL HIGH (ref 0–44)
AST: 87 U/L — ABNORMAL HIGH (ref 15–41)
Albumin: 4.1 g/dL (ref 3.5–5.0)
Alkaline Phosphatase: 93 U/L (ref 38–126)
Anion gap: 12 (ref 5–15)
BUN: 15 mg/dL (ref 6–20)
CO2: 19 mmol/L — ABNORMAL LOW (ref 22–32)
Calcium: 8.4 mg/dL — ABNORMAL LOW (ref 8.9–10.3)
Chloride: 104 mmol/L (ref 98–111)
Creatinine, Ser: 1.5 mg/dL — ABNORMAL HIGH (ref 0.61–1.24)
GFR, Estimated: >60 mL/min (ref >60)
Glucose, Bld: 249 mg/dL — ABNORMAL HIGH (ref 70–99)
Potassium: 3.2 mmol/L — ABNORMAL LOW (ref 3.5–5.1)
Sodium: 135 mmol/L (ref 135–145)
Total Bilirubin: 1 mg/dL (ref 0.3–1.2)
Total Protein: 6.8 g/dL (ref 6.5–8.1)

## 2021-11-14 LAB — CBG MONITORING, ED: Glucose-Capillary: 251 mg/dL — ABNORMAL HIGH (ref 70–99)

## 2021-11-14 LAB — RAPID URINE DRUG SCREEN, HOSP PERFORMED
Amphetamines: NOT DETECTED
Barbiturates: NOT DETECTED
Benzodiazepines: NOT DETECTED
Cocaine: NOT DETECTED
Opiates: NOT DETECTED
Tetrahydrocannabinol: POSITIVE — AB

## 2021-11-14 LAB — CBC WITH DIFFERENTIAL/PLATELET
Abs Immature Granulocytes: 0 10*3/uL (ref 0.00–0.07)
Basophils Absolute: 0 10*3/uL (ref 0.0–0.1)
Basophils Relative: 0 %
Eosinophils Absolute: 0.3 10*3/uL (ref 0.0–0.5)
Eosinophils Relative: 3 %
HCT: 42.4 % (ref 39.0–52.0)
Hemoglobin: 13.9 g/dL (ref 13.0–17.0)
Lymphocytes Relative: 51 %
Lymphs Abs: 5.8 10*3/uL — ABNORMAL HIGH (ref 0.7–4.0)
MCH: 30.8 pg (ref 26.0–34.0)
MCHC: 32.8 g/dL (ref 30.0–36.0)
MCV: 94 fL (ref 80.0–100.0)
Monocytes Absolute: 1 10*3/uL (ref 0.1–1.0)
Monocytes Relative: 9 %
Neutro Abs: 4.2 10*3/uL (ref 1.7–7.7)
Neutrophils Relative %: 37 %
Platelets: 278 10*3/uL (ref 150–400)
RBC: 4.51 MIL/uL (ref 4.22–5.81)
RDW: 11.7 % (ref 11.5–15.5)
WBC: 11.3 10*3/uL — ABNORMAL HIGH (ref 4.0–10.5)
nRBC: 0 % (ref 0.0–0.2)
nRBC: 0 /100 WBC

## 2021-11-14 LAB — ETHANOL: Alcohol, Ethyl (B): <10 mg/dL (ref <10)

## 2021-11-14 LAB — ACETAMINOPHEN LEVEL: Acetaminophen (Tylenol), Serum: <10 ug/mL — ABNORMAL LOW (ref 10–30)

## 2021-11-14 LAB — SALICYLATE LEVEL: Salicylate Lvl: <7 mg/dL — ABNORMAL LOW (ref 7.0–30.0)

## 2021-11-14 MED ORDER — SODIUM CHLORIDE 0.9 % IV SOLN
1.0000 g | Freq: Once | INTRAVENOUS | Status: AC
  Administered 2021-11-14: 1 g via INTRAVENOUS
  Filled 2021-11-14: qty 10

## 2021-11-14 MED ORDER — NALOXONE HCL 2 MG/2ML IJ SOSY
1.0000 mg | PREFILLED_SYRINGE | Freq: Once | INTRAMUSCULAR | Status: AC
  Administered 2021-11-14: 1 mg via INTRAVENOUS
  Filled 2021-11-14: qty 2

## 2021-11-14 MED ORDER — NALOXONE HCL 0.4 MG/ML IJ SOLN
0.4000 mg | Freq: Once | INTRAMUSCULAR | Status: AC
  Administered 2021-11-14: 0.4 mg via INTRAVENOUS
  Filled 2021-11-14: qty 1

## 2021-11-14 MED ORDER — AMPICILLIN-SULBACTAM SODIUM 3 (2-1) G IJ SOLR
3.0000 g | Freq: Four times a day (QID) | INTRAMUSCULAR | Status: DC
Start: 2021-11-14 — End: 2021-11-14

## 2021-11-14 MED ORDER — SODIUM CHLORIDE 0.9 % IV SOLN
500.0000 mg | INTRAVENOUS | Status: DC
Start: 2021-11-15 — End: 2021-11-15
  Administered 2021-11-15: 500 mg via INTRAVENOUS
  Filled 2021-11-14: qty 5

## 2021-11-14 MED ORDER — ALBUTEROL SULFATE (2.5 MG/3ML) 0.083% IN NEBU
2.5000 mg | INHALATION_SOLUTION | RESPIRATORY_TRACT | Status: DC | PRN
Start: 2021-11-14 — End: 2021-11-15

## 2021-11-14 MED ORDER — SODIUM CHLORIDE 0.9 % IV SOLN
3.0000 g | Freq: Four times a day (QID) | INTRAVENOUS | Status: DC
  Administered 2021-11-14 – 2021-11-15 (×4): 3 g via INTRAVENOUS
  Filled 2021-11-14 (×5): qty 8

## 2021-11-14 MED ORDER — ACETAMINOPHEN 650 MG RE SUPP: 650.0000 mg | Freq: Four times a day (QID) | RECTAL | Status: DC | PRN

## 2021-11-14 MED ORDER — ENOXAPARIN SODIUM 40 MG/0.4ML IJ SOSY
40.0000 mg | PREFILLED_SYRINGE | INTRAMUSCULAR | Status: DC
  Administered 2021-11-14 – 2021-11-15 (×2): 40 mg via SUBCUTANEOUS
  Filled 2021-11-14 (×2): qty 0.4

## 2021-11-14 MED ORDER — GUAIFENESIN ER 600 MG PO TB12: 600.0000 mg | ORAL_TABLET | Freq: Two times a day (BID) | ORAL | Status: DC | PRN

## 2021-11-14 MED ORDER — ONDANSETRON HCL 4 MG/2ML IJ SOLN
4.0000 mg | Freq: Once | INTRAMUSCULAR | Status: AC
  Administered 2021-11-14: 4 mg via INTRAVENOUS
  Filled 2021-11-14: qty 2

## 2021-11-14 MED ORDER — ONDANSETRON HCL 4 MG/2ML IJ SOLN
4.0000 mg | Freq: Four times a day (QID) | INTRAMUSCULAR | Status: DC | PRN
  Administered 2021-11-14: 4 mg via INTRAVENOUS
  Filled 2021-11-14: qty 2

## 2021-11-14 MED ORDER — ONDANSETRON HCL 4 MG PO TABS: 4.0000 mg | ORAL_TABLET | Freq: Four times a day (QID) | ORAL | Status: DC | PRN

## 2021-11-14 MED ORDER — ACETAMINOPHEN 325 MG PO TABS: 650.0000 mg | ORAL_TABLET | Freq: Four times a day (QID) | ORAL | Status: DC | PRN

## 2021-11-14 MED ORDER — SODIUM CHLORIDE 0.9% FLUSH
3.0000 mL | Freq: Two times a day (BID) | INTRAVENOUS | Status: DC
  Administered 2021-11-15: 3 mL via INTRAVENOUS

## 2021-11-14 MED ORDER — SODIUM CHLORIDE 0.9 % IV SOLN
500.0000 mg | Freq: Once | INTRAVENOUS | Status: AC
  Administered 2021-11-14: 500 mg via INTRAVENOUS
  Filled 2021-11-14: qty 5

## 2021-11-14 MED ORDER — POTASSIUM CHLORIDE 10 MEQ/100ML IV SOLN
10.0000 meq | INTRAVENOUS | Status: AC
  Administered 2021-11-14 (×4): 10 meq via INTRAVENOUS
  Filled 2021-11-14 (×4): qty 100

## 2021-11-14 MED ORDER — NALOXONE HCL 0.4 MG/ML IJ SOLN
0.4000 mg | INTRAMUSCULAR | Status: DC | PRN
Start: 2021-11-14 — End: 2021-11-15
  Administered 2021-11-14: 0.4 mg via INTRAVENOUS
  Filled 2021-11-14: qty 1

## 2021-11-14 MED ORDER — HYDRALAZINE HCL 20 MG/ML IJ SOLN
5.0000 mg | INTRAMUSCULAR | Status: DC | PRN
Start: 2021-11-14 — End: 2021-11-15

## 2021-11-14 MED ORDER — POLYETHYLENE GLYCOL 3350 17 G PO PACK: 17.0000 g | PACK | Freq: Every day | ORAL | Status: DC | PRN

## 2021-11-14 MED ORDER — BISACODYL 5 MG PO TBEC: 5.0000 mg | DELAYED_RELEASE_TABLET | Freq: Every day | ORAL | Status: DC | PRN

## 2021-11-14 MED ORDER — SODIUM CHLORIDE 0.9 % IV SOLN: INTRAVENOUS | Status: DC

## 2021-11-14 NOTE — Discharge Instructions (Signed)
CSW added resources for out patient in patient rehabs

## 2021-11-14 NOTE — ED Provider Notes (Signed)
[facility] EMERGENCY DEPARTMENT Provider Note   CSN: [id number] Arrival date & time: 11/14/21  0245     History  Chief Complaint  Patient presents with   Drug Overdose  Level 5 caveat due to altered mental status  Paul Bonilla is a 26 y.o. male.  The history is provided by the patient.  Drug Overdose This is a new problem. The problem has been gradually improving.  Patient is an otherwise healthy 26 year old who presents after presumed drug overdose.  On EMS arrival patient was unconscious.  Bystanders report that he had used cocaine and  was drinking alcohol.  Patient was hypoxic and was given intranasal and IV Narcan and he improved On my evaluation patient reports he was using cocaine     Home Medications Prior to Admission medications   Not on File      Allergies    Patient has no known allergies.    Review of Systems   Review of Systems  Unable to perform ROS: Mental status change    Physical Exam Updated Vital Signs BP (!) 148/94   Pulse 95   Temp 97.9 F (36.6 C) (Oral)   Resp 18   Ht 1.651 m (5\' 5" )   Wt 77.1 kg   SpO2 94%   BMI 28.29 kg/m  Physical Exam CONSTITUTIONAL: Disheveled, no acute distress HEAD: Normocephalic/atraumatic EYES: EOMI/PERRL ENMT: Mucous membranes moist NECK: supple no meningeal signs SPINE/BACK:entire spine nontender CV: S1/S2 noted, no murmurs/rubs/gallops noted LUNGS: Tachypnea, crackles bilaterally, on supplemental oxygen ABDOMEN: soft, nontender NEURO: Pt is somnolent and slow to respond but will answer most questions appropriately.  He moves all extremities x4 EXTREMITIES: pulses normal/equal, full ROM, no lower extremity edema SKIN: warm, color normal PSYCH: Unable to assess  ED Results / Procedures / Treatments   Labs (all labs ordered are listed, but only abnormal results are displayed) Labs Reviewed  CBC WITH DIFFERENTIAL/PLATELET - Abnormal; Notable for the following components:       Result Value   WBC 11.3 (*)    Lymphs Abs 5.8 (*)    All other components within normal limits  COMPREHENSIVE METABOLIC PANEL - Abnormal; Notable for the following components:   Potassium 3.2 (*)    CO2 19 (*)    Glucose, Bld 249 (*)    Creatinine, Ser 1.50 (*)    Calcium 8.4 (*)    AST 87 (*)    ALT 53 (*)    All other components within normal limits  RAPID URINE DRUG SCREEN, HOSP PERFORMED - Abnormal; Notable for the following components:   Tetrahydrocannabinol POSITIVE (*)    All other components within normal limits  CBG MONITORING, ED - Abnormal; Notable for the following components:   Glucose-Capillary 251 (*)    All other components within normal limits  ETHANOL  SALICYLATE LEVEL  ACETAMINOPHEN LEVEL    EKG EKG Interpretation  Date/Time:  Thursday November 14 2021 02:51:00 EDT Ventricular Rate:  94 PR Interval:  140 QRS Duration: 86 QT Interval:  357 QTC Calculation: 447 R Axis:   55 Text Interpretation: Sinus rhythm Confirmed by [birth year] (Zadie Rhine) on 11/14/2021 3:05:35 AM Prehospital EKG shows sinus rhythm at 93, similar to EKG performed at 2:51 AM Radiology DG Chest [facility] 1 View  Result Date: 11/14/2021 CLINICAL DATA:  Shortness of breath EXAM: PORTABLE CHEST 1 VIEW COMPARISON:  07/10/2018 FINDINGS: Cardiac shadow is mildly prominent but accentuated by the portable technique. Lungs are well aerated bilaterally. Patchy increased airspace  opacity is noted throughout the left lung. No bony abnormality is noted. IMPRESSION: Patchy airspace disease in the left lung consistent with acute infiltrate. Electronically Signed   By: Alcide Clever M.D.   On: 11/14/2021 03:43    Procedures .Critical Care  Performed by: Zadie Rhine, MD Authorized by: Zadie Rhine, MD   Critical care provider statement:    Critical care time (minutes):  75   Critical care start time:  11/14/2021 4:45 AM   Critical care end time:  11/14/2021 6:00 AM   Critical care time was exclusive of:   Separately billable procedures and treating other patients   Critical care was necessary to treat or prevent imminent or life-threatening deterioration of the following conditions:  Toxidrome and respiratory failure   Critical care was time spent personally by me on the following activities:  Obtaining history from patient or surrogate, examination of patient, evaluation of patient's response to treatment, development of treatment plan with patient or surrogate, re-evaluation of patient's condition, pulse oximetry, ordering and review of laboratory studies, ordering and review of radiographic studies and ordering and performing treatments and interventions   I assumed direction of critical care for this patient from another provider in my specialty: no     Care discussed with: admitting provider       Medications Ordered in ED Medications  ondansetron (ZOFRAN) injection 4 mg (has no administration in time range)  cefTRIAXone (ROCEPHIN) 1 g in sodium chloride 0.9 % 100 mL IVPB (0 g Intravenous Stopped 11/14/21 0520)  azithromycin (ZITHROMAX) 500 mg in sodium chloride 0.9 % 250 mL IVPB (0 mg Intravenous Stopped 11/14/21 0650)  naloxone [facility]) injection 0.4 mg (0.4 mg Intravenous Given 11/14/21 0446)  naloxone [facility]) injection 1 mg (1 mg Intravenous Given 11/14/21 0651)    ED Course/ Medical Decision Making/ A&P Clinical Course as of 11/14/21 0734  Thu Nov 14, 2021  0339 Glucose(!): 249 Leukocytosis [DW]  0339 Creatinine(!): 1.50 Renal insufficiency [DW]  0339 WBC(!): 11.3 Leukocytosis [DW]  0432 Patient still very somnolent.  He will arouse to voice and then go back to sleep.  He has an oxygen requirement.  We will give another dose of Narcan [DW]  0532 Discussed with girlfriend at bedside.  She reports that he does use Percocet but not typically any other medications or drugs. [DW]  3016 Patient given Narcan with some improvement, and increased ventilation, but he is still very somnolent.  He  will need to be admitted to the hospital.  We will also need treatment for pneumonia.  Possible this represents aspiration [DW]  [phone] Patient has been reassessed multiple times.  He would drift off to sleep and his oxygen level will drop, but he is easily arousable. [DW]  [phone] Respiratory rate is now decreasing, will give another dose of Narcan.  He is awaiting admission [DW]  0705 Patient now more alert after Narcan, but still requiring oxygen.  Awaiting admission [DW]  [phone] Patient given another milligram of Narcan he is more alert is now vomiting.  However he is still on oxygen.  He will need to be admitted [DW]    Clinical Course User Index [DW] Zadie Rhine, MD                           Medical Decision Making Amount and/or Complexity of Data Reviewed Labs: ordered. Decision-making details documented in ED Course. Radiology: ordered.  Risk Prescription drug management. Decision regarding hospitalization.  This patient presents to the ED for concern of altered mental status, this involves an extensive number of treatment options, and is a complaint that carries with it a high risk of complications and morbidity.  The differential diagnosis includes but is not limited to CVA, metabolic abnormality, drug overdose, alcohol intoxication  Social Determinants of Health: Patient's  substance use disorder   increases the complexity of managing their presentation Patient lacks primary care  Additional history obtained: Records reviewed Care Everywhere/External Records  Lab Tests: I Ordered, and personally interpreted labs.  The pertinent results include: Leukocytosis  Imaging Studies ordered: I ordered imaging studies including X-ray chest   I independently visualized and interpreted imaging which showed pneumonia I agree with the radiologist interpretation  Cardiac Monitoring: The patient was maintained on a cardiac monitor.  I personally viewed and interpreted the cardiac  monitor which showed an underlying rhythm of:  sinus rhythm  Medicines ordered and prescription drug management: I ordered medication including Narcan for presumed overdose Reevaluation of the patient after these medicines showed that the patient    improved   Critical Interventions:  IV antibiotics, Narcan  Consultations Obtained: I requested consultation with the admitting physician triad hospitalist  Reevaluation: After the interventions noted above, I reevaluated the patient and found that they have :improved  Complexity of problems addressed: Patient's presentation is most consistent with  acute presentation with potential threat to life or bodily function  Disposition: After consideration of the diagnostic results and the patient's response to treatment,  I feel that the patent would benefit from admission   .           Final Clinical Impression(s) / ED Diagnoses Final diagnoses:  Accidental overdose, initial encounter  Acute respiratory failure with hypoxia ([place])  Community acquired pneumonia of left lower lobe of lung    Rx / DC Orders ED Discharge Orders     None         Ripley Fraise, MD 11/14/21 [phone]

## 2021-11-14 NOTE — ED Notes (Signed)
Called lab to have salicylate and acetaminophen levels added to previous sample. Per Terrance in the lab, they will add them on.

## 2021-11-14 NOTE — ED Notes (Signed)
ED TO INPATIENT HANDOFF REPORT  ED Nurse Name and Phone #:  Irving Burton RN [phone]  S Name/Age/Gender Paul Bonilla 26 y.o. male Room/Bed: 031C/031C  Code Status   Code Status: Full Code  Home/SNF/Other Home Patient oriented to: self, place, time, and situation Is this baseline? Yes   Triage Complete: Triage complete  Chief Complaint Drug overdose of undetermined intent, initial encounter [T50.904A]  Triage Note BIB EMS upon arrival patient was unconscious. Friends states he was taking unknown drugs and alcohol. Patient alert and awake upon arrival tot ED. 2mg  intranatral and 0.5mg  IV narcan given enroute.    Allergies Allergies  Allergen Reactions   Lactose Intolerance (Gi) Diarrhea    Level of Care/Admitting Diagnosis ED Disposition     ED Disposition  Admit   Condition  --   Comment  Hospital Area: [facility] [[id number]]  Level of Care: Progressive [102]  Admit to Progressive based on following criteria: NEUROLOGICAL AND NEUROSURGICAL complex patients with significant risk of instability, who do not meet ICU criteria, yet require close observation or frequent assessment (< / = every 2 - 4 hours) with medical / nursing intervention.  Admit to Progressive based on following criteria: ACUTE MENTAL DISORDER-RELATED Drug/Alcohol Ingestion/Overdose/Withdrawal, Suicidal Ideation/attempt requiring safety sitter and < Q2h monitoring/assessments, moderate to severe agitation that is managed with medication/sitter, CIWA-Ar score < 20.  May place patient in observation at [facility] or [facility] if equivalent level of care is available:: Yes  Covid Evaluation: Asymptomatic - no recent exposure (last 10 days) testing not required  Diagnosis: Drug overdose of undetermined intent, initial encounter [phone]  Admitting Physician: [[id number]] [[id number]]  Attending Physician: Jonah Blue [[id number]]          B Medical/Surgery History History reviewed. No  pertinent past medical history. History reviewed. No pertinent surgical history.   A IV Location/Drains/Wounds Patient Lines/Drains/Airways Status     Active Line/Drains/Airways     Name Placement date Placement time Site Days   Peripheral IV 11/14/21 18 G Anterior;Distal;Left;Upper Arm 11/14/21  --  Arm  less than 1   Peripheral IV 11/14/21 20 G Anterior;Right Forearm 11/14/21  0923  Forearm  less than 1            Intake/Output Last 24 hours  Intake/Output Summary (Last 24 hours) at 11/14/2021 1654 Last data filed at 11/14/2021 1647 Gross per 24 hour  Intake 163.01 ml  Output 600 ml  Net -436.99 ml    Labs/Imaging Results for orders placed or performed during the hospital encounter of 11/14/21 (from the past 48 hour(s))  CBG monitoring, ED     Status: Abnormal   Collection Time: 11/14/21  2:52 AM  Result Value Ref Range   Glucose-Capillary 251 (H) 70 - 99 mg/dL    Comment: Glucose reference range applies only to samples taken after fasting for at least 8 hours.   Comment 1 Notify RN    Comment 2 Document in Chart   CBC with Differential     Status: Abnormal   Collection Time: 11/14/21  2:53 AM  Result Value Ref Range   WBC 11.3 (H) 4.0 - 10.5 K/uL   RBC 4.51 4.22 - 5.81 MIL/uL   Hemoglobin 13.9 13.0 - 17.0 g/dL   HCT 01/14/22 40.0 - 86.7 %   MCV 94.0 80.0 - 100.0 fL   MCH 30.8 26.0 - 34.0 pg   MCHC 32.8 30.0 - 36.0 g/dL   RDW 61.9 50.9 - 32.6 %  Platelets 278 150 - 400 K/uL   nRBC 0.0 0.0 - 0.2 %   Neutrophils Relative % 37 %   Neutro Abs 4.2 1.7 - 7.7 K/uL   Lymphocytes Relative 51 %   Lymphs Abs 5.8 (H) 0.7 - 4.0 K/uL   Monocytes Relative 9 %   Monocytes Absolute 1.0 0.1 - 1.0 K/uL   Eosinophils Relative 3 %   Eosinophils Absolute 0.3 0.0 - 0.5 K/uL   Basophils Relative 0 %   Basophils Absolute 0.0 0.0 - 0.1 K/uL   nRBC 0 0 /100 WBC   Abs Immature Granulocytes 0.00 0.00 - 0.07 K/uL    Comment: Performed at [facility] [address]  [place], [place] [address]  Comprehensive metabolic panel     Status: Abnormal   Collection Time: 11/14/21  2:53 AM  Result Value Ref Range   Sodium 135 135 - 145 mmol/L   Potassium 3.2 (L) 3.5 - 5.1 mmol/L   Chloride 104 98 - 111 mmol/L   CO2 19 (L) 22 - 32 mmol/L   Glucose, Bld 249 (H) 70 - 99 mg/dL    Comment: Glucose reference range applies only to samples taken after fasting for at least 8 hours.   BUN 15 6 - 20 mg/dL   Creatinine, Ser 1.50 (H) 0.61 - 1.24 mg/dL   Calcium 8.4 (L) 8.9 - 10.3 mg/dL   Total Protein 6.8 6.5 - 8.1 g/dL   Albumin 4.1 3.5 - 5.0 g/dL   AST 87 (H) 15 - 41 U/L   ALT 53 (H) 0 - 44 U/L   Alkaline Phosphatase 93 38 - 126 U/L   Total Bilirubin 1.0 0.3 - 1.2 mg/dL   GFR, Estimated >60 >60 mL/min    Comment: (NOTE) Calculated using the CKD-EPI Creatinine Equation (2021)    Anion gap 12 5 - 15    Comment: Performed at [facility], [place] [address] [place], [place] [address]  Ethanol     Status: None   Collection Time: 11/14/21  2:53 AM  Result Value Ref Range   Alcohol, Ethyl (B) <10 <10 mg/dL    Comment: (NOTE) Lowest detectable limit for serum alcohol is 10 mg/dL.  For medical purposes only. Performed at [facility], [place] [address], [place], [place] [id number]   Salicylate level     Status: Abnormal   Collection Time: 11/14/21  2:53 AM  Result Value Ref Range   Salicylate Lvl [id number] (L) 7.0 - 30.0 mg/dL    Comment: Performed at [facility] [address], [place], [place] [address]  Acetaminophen level     Status: Abnormal   Collection Time: 11/14/21  2:53 AM  Result Value Ref Range   Acetaminophen (Tylenol), Serum <10 (L) 10 - 30 ug/mL    Comment: (NOTE) Therapeutic concentrations vary significantly. A range of 10-30 ug/mL  may be an effective concentration for many patients. However, some  are best treated at concentrations outside of this range. Acetaminophen concentrations >150 ug/mL at 4 hours after ingestion  and >50 ug/mL at  12 hours after ingestion are often associated with  toxic reactions.  Performed at [facility], [place] [address], [place], [place] [address]   Rapid urine drug screen (hospital performed)     Status: Abnormal   Collection Time: 11/14/21  4:22 AM  Result Value Ref Range   Opiates NONE DETECTED NONE DETECTED   Cocaine NONE DETECTED NONE DETECTED   Benzodiazepines  NONE DETECTED NONE DETECTED   Amphetamines NONE DETECTED NONE DETECTED   Tetrahydrocannabinol POSITIVE (A) NONE DETECTED   Barbiturates NONE DETECTED NONE DETECTED    Comment: (NOTE) DRUG SCREEN FOR MEDICAL PURPOSES ONLY.  IF CONFIRMATION IS NEEDED FOR ANY PURPOSE, NOTIFY LAB WITHIN 5 DAYS.  LOWEST DETECTABLE LIMITS FOR URINE DRUG SCREEN Drug Class                     Cutoff (ng/mL) Amphetamine and metabolites    1000 Barbiturate and metabolites    200 Benzodiazepine                 200 Tricyclics and metabolites     300 Opiates and metabolites        300 Cocaine and metabolites        300 THC                            50 Performed at [facility], [address] [place], [place] [address]    DG Chest Port 1 View  Result Date: 11/14/2021 CLINICAL DATA:  Shortness of breath EXAM: PORTABLE CHEST 1 VIEW COMPARISON:  07/10/2018 FINDINGS: Cardiac shadow is mildly prominent but accentuated by the portable technique. Lungs are well aerated bilaterally. Patchy increased airspace opacity is noted throughout the left lung. No bony abnormality is noted. IMPRESSION: Patchy airspace disease in the left lung consistent with acute infiltrate. Electronically Signed   By: Alcide Clever M.D.   On: 11/14/2021 03:43    Pending Labs Unresulted Labs (From admission, onward)     Start     Ordered   11/15/21 0500  Basic metabolic panel  Tomorrow morning,   R        11/14/21 0845   11/15/21 0500  CBC  Tomorrow morning,   R        11/14/21 0845   11/14/21 0844  Expectorated Sputum Assessment w Gram Stain, Rflx to Resp Cult   (COPD / Pneumonia / Cellulitis / Lower Extremity Wound)  Once,   R        11/14/21 0845            Vitals/Pain Today's Vitals   11/14/21 1430 11/14/21 1600 11/14/21 1602 11/14/21 1615  BP: (!) 169/95   129/79  Pulse: 82 77  65  Resp: 15 14  16   Temp:   98.9 F (37.2 C)   TempSrc:   Oral   SpO2: 95% 98%  98%  Weight:      Height:      PainSc:        Isolation Precautions No active isolations  Medications Medications  azithromycin (ZITHROMAX) 500 mg in sodium chloride 0.9 % 250 mL IVPB (has no administration in time range)  enoxaparin (LOVENOX) injection 40 mg (40 mg Subcutaneous Given 11/14/21 0924)  0.9 %  sodium chloride infusion ( Intravenous Infusion Verify 11/14/21 1024)  acetaminophen (TYLENOL) tablet 650 mg (has no administration in time range)    Or  acetaminophen (TYLENOL) suppository 650 mg (has no administration in time range)  polyethylene glycol (MIRALAX / GLYCOLAX) packet 17 g (has no administration in time range)  bisacodyl (DULCOLAX) EC tablet 5 mg (has no administration in time range)  ondansetron (ZOFRAN) tablet 4 mg (has no administration in time range)    Or  ondansetron (ZOFRAN) injection 4 mg (has no administration in time range)  albuterol (PROVENTIL) (2.5 MG/3ML) 0.083%  nebulizer solution 2.5 mg (has no administration in time range)  guaiFENesin (MUCINEX) 12 hr tablet 600 mg (has no administration in time range)  hydrALAZINE (APRESOLINE) injection 5 mg (has no administration in time range)  naloxone [facility]) injection 0.4 mg (0.4 mg Intravenous Given 11/14/21 1031)  sodium chloride flush (NS) 0.9 % injection 3 mL (3 mLs Intravenous Not Given 11/14/21 0931)  Ampicillin-Sulbactam (UNASYN) 3 g in sodium chloride 0.9 % 100 mL IVPB (0 g Intravenous Stopped 11/14/21 1414)  cefTRIAXone (ROCEPHIN) 1 g in sodium chloride 0.9 % 100 mL IVPB (0 g Intravenous Stopped 11/14/21 0520)  azithromycin (ZITHROMAX) 500 mg in sodium chloride 0.9 % 250 mL IVPB (0 mg Intravenous  Stopped 11/14/21 0650)  naloxone (NARCAN) injection 0.4 mg (0.4 mg Intravenous Given 11/14/21 0446)  naloxone (NARCAN) injection 1 mg (1 mg Intravenous Given 11/14/21 0651)  ondansetron (ZOFRAN) injection 4 mg (4 mg Intravenous Given 11/14/21 0850)    Mobility walks Moderate fall risk   Focused Assessments Cardiac Assessment Handoff:  Cardiac Rhythm: Normal sinus rhythm Lab Results  Component Value Date   TROPONINI <0.03 07/10/2018   No results found for: "DDIMER" Does the Patient currently have chest pain? No   , Neuro Assessment Handoff:  Swallow screen pass? Yes  Cardiac Rhythm: Normal sinus rhythm        , Pulmonary Assessment Handoff:  Lung sounds:   O2 Device: Nasal Cannula O2 Flow Rate (L/min): 4 L/min    R Recommendations: See Admitting Provider Note  Report given to:   Additional Notes:  Pt endorsing that he used percocet last night. Pt is now A&Ox4 and has not required anymore narcan administration since 1031 today.

## 2021-11-14 NOTE — ED Notes (Signed)
Pt endorsing he used percocet.

## 2021-11-14 NOTE — ED Triage Notes (Signed)
BIB EMS upon arrival patient was unconscious. Friends states he was taking unknown drugs and alcohol. Patient alert and awake upon arrival tot ED. 2mg  intranatral and 0.5mg  IV narcan given enroute.

## 2021-11-14 NOTE — H&P (Signed)
History and Physical    Patient: Paul Bonilla:[id number] DOB: 1996/01/16 DOA: 11/14/2021 DOS: the patient was seen and examined on 11/14/2021 PCP: Patient, No Pcp Per  Patient coming from: Home - lives with girlfriend and her son; Jackey Loge: [place] Shown, [phone]   Chief Complaint: Drug overdose  HPI: Paul Bonilla is a 26 y.o. male with medical history significant of polysubstance abuse presenting with AMS, concern for drug overdose.  He was found unresponsive, reports passing out.  He was with his cousin, who got concerned.  His grandmother reports that he was not feeling well, coughing a coarse cough maybe Sunday.  There was question of cocaine by EMS and the EDP but patient appears to now acknowledge taking too much Percocet.  He has been periodically hypersomnolent and responded each time very effectively to Narcan.  He is finally becoming more alert and appears ready to begin to advance his diet, according to nursing.  At the time of my evaluation, his girlfriend's 7yo son was in the room and was coughing periodically and appeared congested.    ER Course:  Opiate overdose.  Given Narcan with improvement.  O2 requirement, PNA on CXR.  Waxing and waning MS.  Wide awake after Narcan with n/v.  ?aspiration.  UDS only + for THC.     Review of Systems: unable to review all systems due to the inability of the patient to answer questions. Past Medical History:  Diagnosis Date   Marijuana abuse    Opiate overdose, accidental or unintentional, initial encounter [facility])    History reviewed. No pertinent surgical history. Social History:  reports that he has been smoking cigars. He has never used smokeless tobacco. He reports current alcohol use. He reports current drug use. Frequency: 7.00 times per week. Drug: Marijuana.  Allergies  Allergen Reactions   Lactose Intolerance (Gi) Diarrhea    History reviewed. No pertinent family history.  Prior to Admission medications   Not on File     Physical Exam: Vitals:   11/14/21 1602 11/14/21 1615 11/14/21 1730 11/14/21 1800  BP:  129/79 136/78 119/78  Pulse:  65 69 75  Resp:  16 13 13   Temp: 98.9 F (37.2 C)     TempSrc: Oral     SpO2:  98% 98% 98%  Weight:      Height:       General:  Appears calm and comfortable and is in NAD; very somnolent but protecting his airway Eyes:   EOMI, normal lids, iris ENT:  grossly normal hearing, lips & tongue, mmm Neck:  no LAD, masses or thyromegaly Cardiovascular:  RRR, no m/r/g. No LE edema.  Respiratory:   CTA bilaterally with no wheezes/rales/rhonchi.  Normal respiratory effort. Abdomen:  soft, NT, ND Skin:  no rash or induration seen on limited exam Musculoskeletal:  grossly normal tone BUE/BLE, good ROM, no bony abnormality Psychiatric:  somnolent mood and affect, speech sparse but appropriate Neurologic:  unable to effectively evaluate   Radiological Exams on Admission: Independently reviewed - see discussion in A/P where applicable  DG Chest Port 1 View  Result Date: 11/14/2021 CLINICAL DATA:  Shortness of breath EXAM: PORTABLE CHEST 1 VIEW COMPARISON:  07/10/2018 FINDINGS: Cardiac shadow is mildly prominent but accentuated by the portable technique. Lungs are well aerated bilaterally. Patchy increased airspace opacity is noted throughout the left lung. No bony abnormality is noted. IMPRESSION: Patchy airspace disease in the left lung consistent with acute infiltrate. Electronically Signed   By: 07/12/2018  M.D.   On: 11/14/2021 03:43    EKG: Independently reviewed.  NSR with rate 94; no evidence of acute ischemia   Labs on Admission: I have personally reviewed the available labs and imaging studies at the time of the admission.  Pertinent labs:    K+ 3.2 CO2 19 Glucose 249 BUN 15/Creatinine 1.5/GFR >60 AST 87/ALT 53 WBC 11.3 ETOH <10 UDS + THC   Assessment and Plan: Principal Problem:   Opiate overdose, accidental or unintentional, initial encounter  (HCC) Active Problems:   Marijuana abuse   Aspiration pneumonia (HCC)   Hypokalemia    Drug overdose -Appears to have been an unintentional overdose of Percocet -The patient reported that he was "chilling" with his cousin prior to event -Denies SI -Of note, Percocet is not a prescribed medication for him -Will observe in progressive care -He has required periodic doses of Narcan, but each time responded appropriately and awoke  PNA -His grandmother reports a coarse cough over the weekend but certainly with his altered LOC aspiration is a serious consideration -He remains hypoxic, as low as 62% on RA and currently on 4L [place] O2 -Hopefully as the narcotic wears off and he gets more alert he will be able to wean off O2 -He is currently on Unasyn and Azithromycin for both aspiration and CAP coverage  Hypokalemia -Mild -Will replete with IV KCl since aspiration is still a consideration  Marijuana abuse -Cessation encouraged; this should be encouraged on an ongoing basis -UDS ordered     Advance Care Planning:   Code Status: Full Code   Consults: RT; TOC team  DVT Prophylaxis: Lovenox  Family Communication: Girlfriend, mother, and grandmother were present throughout evaluation  Severity of Illness: The appropriate patient status for this patient is OBSERVATION. Observation status is judged to be reasonable and necessary in order to provide the required intensity of service to ensure the patient's safety. The patient's presenting symptoms, physical exam findings, and initial radiographic and laboratory data in the context of their medical condition is felt to place them at decreased risk for further clinical deterioration. Furthermore, it is anticipated that the patient will be medically stable for discharge from the hospital within 2 midnights of admission.   Author: Jonah Blue, MD 11/14/2021 6:25 PM  For on call review [email or web address].

## 2021-11-15 LAB — CBC
HCT: 39.4 % (ref 39.0–52.0)
Hemoglobin: 13.6 g/dL (ref 13.0–17.0)
MCH: 31.7 pg (ref 26.0–34.0)
MCHC: 34.5 g/dL (ref 30.0–36.0)
MCV: 91.8 fL (ref 80.0–100.0)
Platelets: 236 10*3/uL (ref 150–400)
RBC: 4.29 MIL/uL (ref 4.22–5.81)
RDW: 11.7 % (ref 11.5–15.5)
WBC: 10.7 10*3/uL — ABNORMAL HIGH (ref 4.0–10.5)
nRBC: 0 % (ref 0.0–0.2)

## 2021-11-15 LAB — BASIC METABOLIC PANEL
Anion gap: 9 (ref 5–15)
BUN: 9 mg/dL (ref 6–20)
CO2: 26 mmol/L (ref 22–32)
Calcium: 9.2 mg/dL (ref 8.9–10.3)
Chloride: 104 mmol/L (ref 98–111)
Creatinine, Ser: 1.01 mg/dL (ref 0.61–1.24)
GFR, Estimated: >60 mL/min (ref >60)
Glucose, Bld: 87 mg/dL (ref 70–99)
Potassium: 3.5 mmol/L (ref 3.5–5.1)
Sodium: 139 mmol/L (ref 135–145)

## 2021-11-15 MED ORDER — AMOXICILLIN-POT CLAVULANATE 875-125 MG PO TABS
1.0000 | ORAL_TABLET | Freq: Two times a day (BID) | ORAL | Status: DC
  Administered 2021-11-15: 1 via ORAL
  Filled 2021-11-15: qty 1

## 2021-11-15 MED ORDER — AMOXICILLIN-POT CLAVULANATE 875-125 MG PO TABS: 1.0000 | ORAL_TABLET | Freq: Two times a day (BID) | ORAL | 0 refills | Status: DC

## 2021-11-15 NOTE — TOC Initial Note (Signed)
Transition of Care [facility]) - Initial/Assessment Note    Patient Details  Name: Paul Bonilla MRN: [id number] Date of Birth: [birth year]  Transition of Care [facility]) CM/SW Contact:    Bethann Berkshire, [place] Phone Number: 11/15/2021, 11:09 AM  Clinical Narrative:                  CSW met with pt in response to substance use/overdose consult. Pt explains he had snorted a small amount of cocaine and then very quickly felt drowsy and intoxicated and then became unconscious. He reports occasional percocet use and cocaine use. Reports he uses THC as well. Pt explained at time of incident he had not used any other substances other than what he thought was cocaine. Pt's UDS was negative for cocaine. CSW discussed possibility that fentanyl could have been in the substance he used based on negative UDS and response to narcan. Pt state he does not feel he has a sever problem with his substance use though does report some desire of stop using cocaine. He is interested in outpatient counseling resources which CSW provides. CSW also discusses narcan and fentanyl test strips to prevent and reverse overdose and provided list of resources where pt could obtain these.   Expected Discharge Plan: Home/Self Care Barriers to Discharge: No Barriers Identified   Patient Goals and CMS Choice        Expected Discharge Plan and Services Expected Discharge Plan: Home/Self Care                                              Prior Living Arrangements/Services                       Activities of Daily Living      Permission Sought/Granted                  Emotional Assessment              Admission diagnosis:  Acute respiratory failure with hypoxia ([place]) [J96.01] Accidental overdose, initial encounter [place] acquired pneumonia of left lower lobe of lung [J18.9] Drug overdose of undetermined intent, initial encounter [T50.904A] Patient Active Problem List   Diagnosis  Date Noted   Opiate overdose, accidental or unintentional, initial encounter ([place]) 11/14/2021   Marijuana abuse 11/14/2021   Aspiration pneumonia ([place]) 11/14/2021   Hypokalemia 11/14/2021   PCP:  Patient, No Pcp Per Pharmacy:   [id number] [place], [address] [place]Phone: [phone]: [phone] [id number] [place]: [phone]: [address]hone: [phone]: [id number] [place]: [phone]: [phone]  Social Determinants of Health (SDOH) Interventions    Readmission Risk Interventions     No data to display

## 2021-11-15 NOTE — Progress Notes (Signed)
Pt NSR on monitor and A/O x 4. Admitted for opiate overdose. See note. Denies pain and SOB. Education provided. Pt adequate for discharge

## 2021-11-15 NOTE — Discharge Summary (Signed)
DISCHARGE SUMMARY  FLORIAN CHAUCA  MR#: [id number]  DOB:07/09/1995  Date of Admission: 11/14/2021 Date of Discharge: 11/15/2021  Attending Physician:Sequoia Mincey Silvestre Gunner, MD  Patient's NWG:[id number], No Pcp Per  Consults: none   Disposition: D/C home   Follow-up Appts:  Follow-up Information     [facility]. Go on 12/30/2021.   Specialty: Internal Medicine Why: @9 : information: [address] [place] [phone]               Discharge Diagnoses: Drug overdose - narcotic  Left lung pulmonary infiltrates Acute hypoxic respiratory failure Possible aspiration pneumonia v/s pneumonitis  Acute kidney injury POA Hypokalemia  Initial presentation: 26 year old with a history of polysubstance abuse who presented to the ER with altered mental status after being found unresponsive/passed out.  In the ER he was periodically hypersomnolent but responded favorably each time he was dosed with Narcan.  He acknowledges "snorting" a small amount of what he believed was cocaine, and shortly thereafter feeling "very different" than he typically does when using cocaine. Shortly thereafter he became unresponsive.  CXR in the ER raised the question of diffuse left lung infiltrates. He responded w/ abrupt improvement in mental status with each dose of narcan administered via EMS and in the ER.   Hospital Course:  Drug overdose - Narcotic  Appears to have been accidental/recreational with no evidence of intended self-harm -responded favorably to periodic doses of Narcan but did not require Narcan drip -fortunately acetaminophen level was undetectable -salicylate level also undetectable - suspicion is that the substance he inhaled was a strong narcotic and not cocaine (UDS negative for cocaine) - he has been counseled on absolute need to avoid all illicit substances, and on the prominence of fentanyl in the community as well as the high  risk of death from fentanyl abuse - he has also been counseled on the availability of narcan via numerous community resources   Left lung pulmonary infiltrates -acute hypoxic respiratory failure Suspicious for aspiration in this clinical setting -respiratory failure at presentation evidenced by oxygen saturation of 62% on room air, hypoventilation, and obtundation - rapidly improved clinically w/ improved mental status - will complete only 3 days of abx as this is likely a chemical pneumonitis v/s a true bacterial infection    Acute kidney injury POA Baseline creatinine per records is 0.97 -creatinine at presentation was 1.5 representing a significant increase likely due to poor intake in the setting of narcotic overdose -renal function rapidly normalized with volume expansion   Hypokalemia Corrected with supplementation  Allergies as of 11/15/2021       Reactions   Lactose Intolerance (gi) Diarrhea        Medication List     STOP taking these medications    doxycycline 100 MG capsule Commonly known as: VIBRAMYCIN       TAKE these medications    amoxicillin-clavulanate 875-125 MG tablet Commonly known as: AUGMENTIN Take 1 tablet by mouth every 12 (twelve) hours. Notes to patient: Take tomorrow morning 11/16/21        Day of Discharge BP (!) 151/107 (BP Location: Right Arm)   Pulse 94   Temp 98.1 F (36.7 C) (Oral)   Resp 19   Ht 5\' 5"  (1.651 m)   Wt 83.8 kg   SpO2 97%   BMI 30.74 kg/m   Physical Exam: General: No acute respiratory distress Lungs: Clear to auscultation bilaterally without wheezes or crackles Cardiovascular: Regular rate  and rhythm without murmur gallop or rub normal S1 and S2 Abdomen: Nontender, nondistended, soft, bowel sounds positive, no rebound, no ascites, no appreciable mass Extremities: No significant cyanosis, clubbing, or edema bilateral lower extremities  Basic Metabolic Panel: Recent Labs  Lab 11/14/21 0253 11/15/21 0428  NA 135  139  K 3.2* 3.5  CL 104 104  CO2 19* 26  GLUCOSE 249* 87  BUN 15 9  CREATININE 1.50* 1.01  CALCIUM 8.4* 9.2    Liver Function Tests: Recent Labs  Lab 11/14/21 0253  AST 87*  ALT 53*  ALKPHOS 93  BILITOT 1.0  PROT 6.8  ALBUMIN 4.1    CBC: Recent Labs  Lab 11/14/21 0253 11/15/21 0428  WBC 11.3* 10.7*  NEUTROABS 4.2  --   HGB 13.9 13.6  HCT 42.4 39.4  MCV 94.0 91.8  PLT 278 236    Time spent in discharge (includes decision making & examination of pt): 30 minutes  11/15/2021, 4:04 PM   Lonia Blood, MD [facility] Office  [phone]

## 2021-11-15 NOTE — Plan of Care (Signed)
  Problem: Pain Managment: Goal: General experience of comfort will improve Outcome: Completed/Met   Problem: Skin Integrity: Goal: Risk for impaired skin integrity will decrease Outcome: Completed/Met

## 2021-12-30 ENCOUNTER — Ambulatory Visit: Payer: Self-pay | Admitting: Nurse Practitioner

## 2022-11-28 ENCOUNTER — Ambulatory Visit (HOSPITAL_COMMUNITY)
Admission: EM | Admit: 2022-11-28 | Discharge: 2022-11-28 | Disposition: A | Discharge status: Home / Self Care | Payer: Self-pay | Attending: Nurse Practitioner | Admitting: Nurse Practitioner

## 2022-11-28 ENCOUNTER — Encounter (HOSPITAL_COMMUNITY): Payer: Self-pay

## 2022-11-28 DIAGNOSIS — Z202 Contact with and (suspected) exposure to infections with a predominantly sexual mode of transmission: Secondary | ICD-10-CM | POA: Insufficient documentation

## 2022-11-28 DIAGNOSIS — N4889 Other specified disorders of penis: Secondary | ICD-10-CM | POA: Insufficient documentation

## 2022-11-28 LAB — HIV ANTIBODY (ROUTINE TESTING W REFLEX): HIV Screen 4th Generation wRfx: NONREACTIVE

## 2022-11-28 MED ORDER — TINIDAZOLE 500 MG PO TABS: 2.0000 g | ORAL_TABLET | Freq: Once | ORAL | 0 refills | Status: AC

## 2022-11-28 NOTE — ED Triage Notes (Signed)
Patient c/o penile discomfort and nausea x 1-2 weeks. Patient also reports that a sexual partner told him that she had trichomonas.

## 2022-11-28 NOTE — Discharge Instructions (Signed)
We will contact you if any of the testing from today comes back positive.  In the meantime, take the tinidazole as prescribed to treat for trichomoniasis.  Recommend condom use with every sexual encounter to prevent STI.

## 2022-11-28 NOTE — ED Provider Notes (Signed)
[facility]    CSN: [id number] Arrival date & time: 11/28/22  4132      History   Chief Complaint No chief complaint on file.   HPI Paul Bonilla is a 27 y.o. male.   Patient presents today for intermittent penile and testicle pain for the past 1-2 weeks. Denies sores, rashes, lesions on genitalia. No penile discharge or urinary symptoms.  Reports a sexual partner recently told him they tested positive for trichomoniasis.  No other known exposures.  Denies abdominal pain, pelvic pain, nausea, vomiting, and fevers.    Past Medical History:  Diagnosis Date   Marijuana abuse    Opiate overdose, accidental or unintentional, initial encounter [facility])     Patient Active Problem List   Diagnosis Date Noted   Opiate overdose, accidental or unintentional, initial encounter (HCC) 11/14/2021   Marijuana abuse 11/14/2021   Aspiration pneumonia (HCC) 11/14/2021   Hypokalemia 11/14/2021    History reviewed. No pertinent surgical history.     Home Medications    Prior to Admission medications   Medication Sig Start Date End Date Taking? Authorizing Provider  tinidazole (TINDAMAX) 500 MG tablet Take 4 tablets (2,000 mg total) by mouth once for 1 dose. 11/28/22 11/28/22 Yes Valentino Nose, NP    Family History Family History  Family history unknown: Yes    Social History Social History   Tobacco Use   Smoking status: Some Days    Types: Cigars   Smokeless tobacco: Never  Vaping Use   Vaping status: Never Used  Substance Use Topics   Alcohol use: Yes    Comment: occ   Drug use: Yes    Frequency: 7.0 times per week    Types: Marijuana     Allergies   Lactose intolerance (gi)   Review of Systems Review of Systems Per HPI  Physical Exam Triage Vital Signs ED Triage Vitals  Encounter Vitals Group     BP 11/28/22 1025 (!) 140/92     Systolic BP Percentile --      Diastolic BP Percentile --      Pulse Rate 11/28/22 1025 71     Resp 11/28/22  1025 16     Temp 11/28/22 1025 98.2 F (36.8 C)     Temp Source 11/28/22 1025 Oral     SpO2 11/28/22 1025 95 %     Weight --      Height --      Head Circumference --      Peak Flow --      Pain Score 11/28/22 1027 2     Pain Loc --      Pain Education --      Exclude from Growth Chart --    No data found.  Updated Vital Signs BP (!) 140/92 (BP Location: Right Arm)   Pulse 71   Temp 98.2 F (36.8 C) (Oral)   Resp 16   SpO2 95%   Visual Acuity Right Eye Distance:   Left Eye Distance:   Bilateral Distance:    Right Eye Near:   Left Eye Near:    Bilateral Near:     Physical Exam Vitals and nursing note reviewed.  Constitutional:      General: He is not in acute distress.    Appearance: Normal appearance. He is not toxic-appearing.  Pulmonary:     Effort: Pulmonary effort is normal. No respiratory distress.  Genitourinary:    Comments: Deferred - self swab performed by patient  Skin:    General: Skin is warm and dry.     Capillary Refill: Capillary refill takes less than 2 seconds.     Coloration: Skin is not jaundiced or pale.  Neurological:     Mental Status: He is alert and oriented to person, place, and time.  Psychiatric:        Behavior: Behavior is cooperative.      UC Treatments / Results  Labs (all labs ordered are listed, but only abnormal results are displayed) Labs Reviewed  HIV ANTIBODY (ROUTINE TESTING W REFLEX)  RPR  CYTOLOGY, (ORAL, ANAL, URETHRAL) ANCILLARY ONLY    EKG   Radiology No results found.  Procedures Procedures (including critical care time)  Medications Ordered in UC Medications - No data to display  Initial Impression / Assessment and Plan / UC Course  I have reviewed the triage vital signs and the nursing notes.  Pertinent labs & imaging results that were available during my care of the patient were reviewed by me and considered in my medical decision making (see chart for details).   Patient is well-appearing,  afebrile, not tachycardic, not tachypneic, oxygenating well on room air.  Patient is mildly hypertensive in triage today.  1. Exposure to trichomonas 2. Penile pain Discussed metronidazole with patient, however patient prefers single dose medication if available Tinidazole 2g once sent to pharmacy Cytology pending-treat as indicated if anything positive other than trichomonas HIV and syphilis testing pending Recommended condom use with every sexual encounter, patient declines condoms today  The patient was given the opportunity to ask questions.  All questions answered to their satisfaction.  The patient is in agreement to this plan.    Final Clinical Impressions(s) / UC Diagnoses   Final diagnoses:  Exposure to trichomonas  Penile pain     Discharge Instructions      We will contact you if any of the testing from today comes back positive.  In the meantime, take the tinidazole as prescribed to treat for trichomoniasis.  Recommend condom use with every sexual encounter to prevent STI.     ED Prescriptions     Medication Sig Dispense Auth. Provider   tinidazole (TINDAMAX) 500 MG tablet Take 4 tablets (2,000 mg total) by mouth once for 1 dose. 4 tablet Valentino Nose, NP      PDMP not reviewed this encounter.   Valentino Nose, NP 11/28/22 1131

## 2022-11-29 LAB — RPR: RPR Ser Ql: NONREACTIVE

## 2022-12-01 LAB — CYTOLOGY, (ORAL, ANAL, URETHRAL) ANCILLARY ONLY
Chlamydia: NEGATIVE
Comment: NEGATIVE
Comment: NEGATIVE
Comment: NORMAL
Neisseria Gonorrhea: NEGATIVE
Trichomonas: POSITIVE — AB

## 2023-09-22 ENCOUNTER — Encounter (HOSPITAL_COMMUNITY): Payer: Self-pay

## 2023-09-22 ENCOUNTER — Ambulatory Visit (HOSPITAL_COMMUNITY)
Admission: EM | Admit: 2023-09-22 | Discharge: 2023-09-22 | Disposition: A | Discharge status: Home / Self Care | Payer: Self-pay | Attending: Family Medicine | Admitting: Family Medicine

## 2023-09-22 DIAGNOSIS — Z113 Encounter for screening for infections with a predominantly sexual mode of transmission: Secondary | ICD-10-CM

## 2023-09-22 DIAGNOSIS — R030 Elevated blood-pressure reading, without diagnosis of hypertension: Secondary | ICD-10-CM | POA: Insufficient documentation

## 2023-09-22 NOTE — ED Triage Notes (Signed)
 Patient here today to be tested for Stds after being told that his girl tested positive for Trich. Patient denies symptoms.

## 2023-09-22 NOTE — ED Provider Notes (Signed)
 [facility]    CSN: [id number] Arrival date & time: 09/22/23  1051      History   Chief Complaint Chief Complaint  Patient presents with   SEXUALLY TRANSMITTED DISEASE    HPI LARRI YEHLE is a 28 y.o. male.   28 year old male patient, Maxden Naji, presents to urgent care for evaluation of routine STD testing.  Patient states that his girlfriend told him she was positive for trichomoniasis patient denies any symptoms  The history is provided by the patient. No language interpreter was used.    Past Medical History:  Diagnosis Date   Marijuana abuse    Opiate overdose, accidental or unintentional, initial encounter [facility])     Patient Active Problem List   Diagnosis Date Noted   Routine screening for STI (sexually transmitted infection) 09/22/2023   Elevated blood pressure reading 09/22/2023   Opiate overdose, accidental or unintentional, initial encounter (HCC) 11/14/2021   Marijuana abuse 11/14/2021   Aspiration pneumonia (HCC) 11/14/2021   Hypokalemia 11/14/2021    History reviewed. No pertinent surgical history.     Home Medications    Prior to Admission medications   Not on File    Family History Family History  Family history unknown: Yes    Social History Social History   Tobacco Use   Smoking status: Some Days    Types: Cigars   Smokeless tobacco: Never  Vaping Use   Vaping status: Never Used  Substance Use Topics   Alcohol use: Yes    Comment: occ   Drug use: Yes    Frequency: 7.0 times per week    Types: Marijuana     Allergies   Lactose intolerance (gi)   Review of Systems Review of Systems  Constitutional:  Negative for fever.  Genitourinary:  Negative for dysuria, genital sores, penile discharge, penile pain, penile swelling, scrotal swelling, testicular pain and urgency.  All other systems reviewed and are negative.    Physical Exam Triage Vital Signs ED Triage Vitals  Encounter Vitals Group     BP  09/22/23 1152 (!) 141/87     Systolic BP Percentile --      Diastolic BP Percentile --      Pulse Rate 09/22/23 1152 95     Resp 09/22/23 1152 16     Temp 09/22/23 1152 97.8 F (36.6 C)     Temp Source 09/22/23 1152 Oral     SpO2 09/22/23 1152 (!) 87 %     Weight --      Height --      Head Circumference --      Peak Flow --      Pain Score 09/22/23 1150 0     Pain Loc --      Pain Education --      Exclude from Growth Chart --    No data found.  Updated Vital Signs BP (!) 141/87 (BP Location: Right Arm)   Pulse 87   Temp 97.8 F (36.6 C) (Oral)   Resp 16   SpO2 95%   Visual Acuity Right Eye Distance:   Left Eye Distance:   Bilateral Distance:    Right Eye Near:   Left Eye Near:    Bilateral Near:     Physical Exam Vitals and nursing note reviewed.  Constitutional:      General: He is not in acute distress.    Appearance: He is well-developed and well-groomed.  HENT:     Head: Normocephalic and  atraumatic.  Eyes:     Conjunctiva/sclera: Conjunctivae normal.  Cardiovascular:     Rate and Rhythm: Normal rate and regular rhythm.     Heart sounds: No murmur heard. Pulmonary:     Effort: Pulmonary effort is normal. No respiratory distress.  Abdominal:     Palpations: Abdomen is soft.     Tenderness: There is no abdominal tenderness.  Musculoskeletal:        General: No swelling.     Cervical back: Neck supple.  Skin:    General: Skin is warm and dry.     Capillary Refill: Capillary refill takes less than 2 seconds.  Neurological:     General: No focal deficit present.     Mental Status: He is alert and oriented to person, place, and time.     GCS: GCS eye subscore is 4. GCS verbal subscore is 5. GCS motor subscore is 6.  Psychiatric:        Attention and Perception: Attention normal.        Mood and Affect: Mood normal.        Speech: Speech normal.        Behavior: Behavior normal. Behavior is cooperative.      UC Treatments / Results  Labs (all  labs ordered are listed, but only abnormal results are displayed) Labs Reviewed  CYTOLOGY, (ORAL, ANAL, URETHRAL) ANCILLARY ONLY    EKG   Radiology No results found.  Procedures Procedures (including critical care time)  Medications Ordered in UC Medications - No data to display  Initial Impression / Assessment and Plan / UC Course  I have reviewed the triage vital signs and the nursing notes.  Pertinent labs & imaging results that were available during my care of the patient were reviewed by me and considered in my medical decision making (see chart for details).    Discussed exam findings and plan of care with patient, patient aware will not be notified if results are negative , if further treatment is indicated patient will be notified and medication will be scripted at that time, safe sex precautions, strict go to ER precautions given.   Patient verbalized understanding to this provider.  Ddx: Routine STI testing, elevated blood pressure reading Final Clinical Impressions(s) / UC Diagnoses   Final diagnoses:  Routine screening for STI (sexually transmitted infection)  Elevated blood pressure reading     Discharge Instructions      Please have PCP rechecked as it was elevated in office today. Check my chart for results. Avoid sexual activity until results,treatment known and completed. Safe sex with all future sexual activity. We have sent testing for sexually transmitted infections. We will notify you of any positive results once they are received. If required, we will prescribe any medications you might need. We will not notify you of negative results.     Follow up with PCP. Return as needed.   ED Prescriptions   None    PDMP not reviewed this encounter.   Peter Brands, NP 09/22/23 1559

## 2023-09-22 NOTE — Discharge Instructions (Addendum)
 Please have PCP rechecked as it was elevated in office today. Check my chart for results. Avoid sexual activity until results,treatment known and completed. Safe sex with all future sexual activity. We have sent testing for sexually transmitted infections. We will notify you of any positive results once they are received. If required, we will prescribe any medications you might need. We will not notify you of negative results.     Follow up with PCP. Return as needed.

## 2023-09-23 ENCOUNTER — Ambulatory Visit (HOSPITAL_COMMUNITY): Payer: Self-pay

## 2023-09-23 LAB — CYTOLOGY, (ORAL, ANAL, URETHRAL) ANCILLARY ONLY
Chlamydia: NEGATIVE
Comment: NEGATIVE
Comment: NEGATIVE
Comment: NORMAL
Neisseria Gonorrhea: NEGATIVE
Trichomonas: POSITIVE — AB

## 2023-09-23 MED ORDER — METRONIDAZOLE 500 MG PO TABS: 2000.0000 mg | ORAL_TABLET | Freq: Once | ORAL | 0 refills | Status: AC
